# Patient Record
Sex: Male | Born: 1960 | Race: Asian | Hispanic: No
Health system: Southern US, Community
[De-identification: ages and names within clinical notes are randomized; demographics above are authoritative.]

## PROBLEM LIST (undated history)

## (undated) DIAGNOSIS — I10 Essential (primary) hypertension: Secondary | ICD-10-CM

## (undated) DIAGNOSIS — E119 Type 2 diabetes mellitus without complications: Secondary | ICD-10-CM

## (undated) DIAGNOSIS — K219 Gastro-esophageal reflux disease without esophagitis: Secondary | ICD-10-CM

## (undated) DIAGNOSIS — E785 Hyperlipidemia, unspecified: Secondary | ICD-10-CM

## (undated) HISTORY — DX: Type 2 diabetes mellitus without complications: E11.9

## (undated) HISTORY — DX: Gastro-esophageal reflux disease without esophagitis: K21.9

## (undated) HISTORY — DX: Hyperlipidemia, unspecified: E78.5

---

## 2018-07-12 ENCOUNTER — Ambulatory Visit: Payer: 59 | Attending: Family Medicine | Admitting: Physical Therapy

## 2018-07-12 ENCOUNTER — Encounter: Payer: Self-pay | Admitting: Physical Therapy

## 2018-07-12 DIAGNOSIS — M542 Cervicalgia: Secondary | ICD-10-CM

## 2018-07-12 NOTE — Therapy (Signed)
[facility] [place] [address] [facility] [address] [place], [place], [address] Phone: [phone]   Fax:  [phone]  Physical Therapy Evaluation  Patient Details  Name: Chad Mcbride MRN: [id number] Date of Birth: 1961/06/08 Referring Provider (PT): Tammy Eartha Inch, MD   Encounter Date: 07/12/2018  PT End of Session - 07/12/18 0842    Visit Number  1    Number of Visits  12    Date for PT Re-Evaluation  08/23/18    Authorization Type  UHC, VL 60    PT Start Time  0805    PT Stop Time  0850    PT Time Calculation (min)  45 min    Activity Tolerance  Patient tolerated treatment well    Behavior During Therapy  [facility] for tasks assessed/performed       History reviewed. No pertinent past medical history.  History reviewed. No pertinent surgical history.  There were no vitals filed for this visit.   Subjective Assessment - 07/12/18 0812    Subjective  Pt relays chronic neck pain for years that is off and on, localized to upper neck around occiput. He denies N/T in her arms, he has not had imaging or treatment before. He is not taking any meds other than insulin, and has not tried heat or ice. He relays aggravating factors are anything if he is hurting that day, if he is not then nothing aggravates his neck. He denies any diziness.    Pertinent History  DM    Currently in Pain?  Yes    Pain Score  5     Pain Location  Neck    Pain Orientation  Upper    Pain Descriptors / Indicators  Aching;Dull;Tightness    Pain Type  Chronic pain    Pain Onset  More than a month ago    Pain Frequency  Intermittent    Multiple Pain Sites  No         [facility] PT Assessment - 07/12/18 0001      Assessment   Medical Diagnosis  cervicalgia    Referring Provider (PT)  Tammy Eartha Inch, MD    Onset Date/Surgical Date  --   chronic   Next MD Visit  Febuary    Prior Therapy  none      Precautions   Precautions  None      Balance Screen   Has the  patient fallen in the past 6 months  No      Home Environment   Living Environment  Private residence    Additional Comments  has stairs, no trouble with this      Prior Function   Level of Independence  Independent    Vocation  Full time employment    Vocation Requirements  computer work      Cognition   Overall Cognitive Status  Within Functional Limits for tasks assessed      Observation/Other Assessments   Focus on Therapeutic Outcomes (FOTO)   37% limited      Posture/Postural Control   Posture Comments  mild fwd head, mod thoracic kyphosis      ROM / Strength   AROM / PROM / Strength  AROM;Strength      AROM   AROM Assessment Site  Cervical    Cervical Flexion  75%    Cervical Extension  50%    Cervical - Right Side Bend  50%    Cervical - Left Side  Bend  50%    Cervical - Right Rotation  65 deg    Cervical - Left Rotation  50 deg      Strength   Overall Strength Comments  elbow strength 5/5 bilat, Rt shoulder 4/5 MMT, Lt shoulder 4+ MMT      Flexibility   Soft Tissue Assessment /Muscle Length  --   tight cerv P.S, Upper traps, levator     Palpation   Spinal mobility  C-T spine hypomobility    Palpation comment  TTP C1-3      Special Tests   Other special tests  neg spulings but + distraction test                Objective measurements completed on examination: See above findings.      [facility] Adult PT Treatment/Exercise - 07/12/18 0001      Modalities   Modalities  Moist Heat;Electrical Stimulation      Moist Heat Therapy   Number Minutes Moist Heat  12 Minutes    Moist Heat Location  Cervical      Electrical Stimulation   Electrical Stimulation Location  cervical    Electrical Stimulation Action  IFC 12 min    Electrical Stimulation Parameters  supine    Electrical Stimulation Goals  Pain      Manual Therapy   Manual therapy comments  gentle C-spine central PA mobs, S.O relase, gentle manual traction             PT Education  - 07/12/18 0840    Education Details  HEP, POC,TENS    Person(s) Educated  Patient    Methods  Explanation;Demonstration;Verbal cues;Handout    Comprehension  Verbalized understanding;Need further instruction          PT Long Term Goals - 07/12/18 0854      PT LONG TERM GOAL #1   Title  Pt will be I and compliant with HEP. 6 weeks 08/23/18    Status  New      PT LONG TERM GOAL #2   Title  Pt will improve FOTO to less than 30% limited. 6 weeks 08/23/18    Status  New      PT LONG TERM GOAL #3   Title  Pt will improve neck ROM to [facility] and rotation to at least 70 deg bilat. 6 weeks 08/23/18    Status  New      PT LONG TERM GOAL #4   Title  Pt will decrease overall pain to less than 2/10 on avg with usual activity. 6 weeks 08/23/18    Status  New             Plan - 07/12/18 0842    Clinical Impression Statement  Pt presents with chronic intermittent cervicalgia likely due to C-spine hypomobility and increased muscular tension. He has decreased ROM, decreased shoulder strength, postural dysfunciton and increased pain limiting his full funciton. He will benefit from skilled PT to address his deficits.     Clinical Presentation  Evolving    Clinical Presentation due to:  worsening pain over time    Clinical Decision Making  Moderate    Rehab Potential  Good    PT Frequency  2x / week    PT Duration  6 weeks    PT Treatment/Interventions  Cryotherapy;Electrical Stimulation;Iontophoresis 4mg /ml Dexamethasone;Moist Heat;Traction;Ultrasound;Therapeutic activities;Therapeutic exercise;Neuromuscular re-education;Manual techniques;Passive range of motion;Taping;Dry needling;Joint Manipulations;Spinal Manipulations    PT Next Visit Plan  neck stretching and mobs, scapular and  shoulder strength, consider MT, modalaties, traction,DN    Consulted and Agree with Plan of Care  Patient       Patient will benefit from skilled therapeutic intervention in order to improve the following  deficits and impairments:  Decreased activity tolerance, Decreased endurance, Decreased range of motion, Decreased strength, Hypomobility, Increased fascial restricitons, Increased muscle spasms, Impaired flexibility, Improper body mechanics, Postural dysfunction, Pain  Visit Diagnosis: Cervicalgia     Problem List There are no active problems to display for this patient.   April Manson, PT, DPT 07/12/2018, 8:58 AM  [facility] [place] [facility] [address] [place], [place], [address] Phone: [phone]   Fax:  [phone]  Name: Chad Mcbride MRN: [id number] Date of Birth: 10-12-1960

## 2018-07-12 NOTE — Patient Instructions (Signed)

## 2018-07-18 ENCOUNTER — Encounter: Payer: Self-pay | Admitting: Physical Therapy

## 2018-07-18 ENCOUNTER — Ambulatory Visit: Payer: 59 | Admitting: Physical Therapy

## 2018-07-18 DIAGNOSIS — M542 Cervicalgia: Secondary | ICD-10-CM | POA: Diagnosis not present

## 2018-07-18 NOTE — Therapy (Signed)
[place], [place], [address] Phone: [phone]   Fax:  [phone]  Physical Therapy Treatment  Patient Details  Name: Chad Mcbride MRN: [id number] Date of Birth: May 21, 1961 Referring Provider (PT): Tammy Arn Medal, MD   Encounter Date: 07/18/2018  PT End of Session - 07/18/18 0842    Visit Number  2    Number of Visits  12    Date for PT Re-Evaluation  08/23/18    PT Start Time  0800    PT Stop Time  0855    PT Time Calculation (min)  55 min    Activity Tolerance  Patient tolerated treatment well    Behavior During Therapy  [facility] for tasks assessed/performed       History reviewed. No pertinent past medical history.  History reviewed. No pertinent surgical history.  There were no vitals filed for this visit.  Subjective Assessment - 07/18/18 0800    Subjective  pt reports that his neck has been feeling ok.     Currently in Pain?  Yes    Pain Score  3     Pain Location  Neck    Pain Descriptors / Indicators  Discomfort                       [facility] Adult PT Treatment/Exercise - 07/18/18 0001      Exercises   Exercises  Neck      Neck Exercises: Machines for Strengthening   UBE (Upper Arm Bike)  L2 49fd/2bak      Neck Exercises: Theraband   Shoulder Extension  20 reps;Red    Rows  20 reps;Red    Shoulder External Rotation  20 reps;Red    Horizontal ADduction  10 reps;Red      Neck Exercises: Standing   Wall Push Ups  20 reps    Other Standing Exercises  OHP yellow ball 2x10       Modalities   Modalities  Moist Heat;Electrical Stimulation      Moist Heat Therapy   Number Minutes Moist Heat  15 Minutes    Moist Heat Location  Cervical      Electrical Stimulation   Electrical Stimulation Location  cervical    Electrical Stimulation Action  IFC    Electrical Stimulation Parameters  supine    Electrical Stimulation Goals  Pain      Manual Therapy   Manual Therapy  Soft tissue mobilization;Passive ROM;Manual Traction    Soft tissue mobilization  posterior Cervical para spinales into upp er traps    Passive ROM  c spine all dirextions    Manual Traction  4x15''                  PT Long Term Goals - 07/18/18 0847      PT LONG TERM GOAL #1   Title  Pt will be I and compliant with HEP. 6 weeks 08/23/18    Status  Partially Met            Plan - 07/18/18 0844    Clinical Impression Statement  Pt tolerated an initial progression to TE well. Postural cues needed with standing rows and extensions. UE did fatigue during aerobic warm up with little resistance. Pt reports a good stretch with passive cervical rotation.     Rehab Potential  Good    PT Next Visit Plan  neck stretching and mobs, scapular and  shoulder strength, consider MT, modalities, traction,DN       Patient will benefit from skilled therapeutic intervention in order to improve the following deficits and impairments:  Decreased activity tolerance, Decreased endurance, Decreased range of motion, Decreased strength, Hypomobility, Increased fascial restricitons, Increased muscle spasms, Impaired flexibility, Improper body mechanics, Postural dysfunction, Pain  Visit Diagnosis: Cervicalgia     Problem List There are no active problems to display for this patient.   Scot Jun, PTA 07/18/2018, 8:47 AM  [address] [place], [place], [address] Phone: [phone]   Fax:  [phone]  Name: Kinston Magnan MRN: [id number] Date of Birth: October 03, 1960

## 2018-07-21 ENCOUNTER — Encounter: Payer: Self-pay | Admitting: Physical Therapy

## 2018-07-21 ENCOUNTER — Ambulatory Visit: Payer: 59 | Admitting: Physical Therapy

## 2018-07-21 DIAGNOSIS — M542 Cervicalgia: Secondary | ICD-10-CM

## 2018-07-21 NOTE — Therapy (Signed)
[place], [place], [address] Phone: [phone]   Fax:  [phone]  Physical Therapy Treatment  Patient Details  Name: Quinlan Vollmer MRN: [id number] Date of Birth: April 07, 1961 Referring Provider (PT): Tammy Arn Medal, MD   Encounter Date: 07/21/2018  PT End of Session - 07/21/18 0843    Visit Number  3    Date for PT Re-Evaluation  08/23/18    Authorization Type  UHC, VL 60    PT Start Time  0800    PT Stop Time  0855    PT Time Calculation (min)  55 min    Activity Tolerance  Patient tolerated treatment well    Behavior During Therapy  [facility] for tasks assessed/performed       History reviewed. No pertinent past medical history.  History reviewed. No pertinent surgical history.  There were no vitals filed for this visit.  Subjective Assessment - 07/21/18 0804    Subjective  PT stated that he is doing good with very little pain    Currently in Pain?  Yes    Pain Score  2     Pain Location  Neck         [facility] PT Assessment - 07/21/18 0001      AROM   Cervical Flexion  WFL    Cervical Extension  WFL    Cervical - Right Side Bend  75%    Cervical - Left Side Bend  75%    Cervical - Right Rotation  WFL    Cervical - Left Rotation  [facility]                   [facility] Adult PT Treatment/Exercise - 07/21/18 0001      Neck Exercises: Machines for Strengthening   UBE (Upper Arm Bike)  L2 36fd/3bak      Neck Exercises: Theraband   Shoulder Extension  20 reps;Green    Rows  20 reps;Green    Shoulder External Rotation  20 reps;Red    Horizontal ADduction  Red;20 reps      Neck Exercises: Standing   Wall Push Ups  20 reps    Other Standing Exercises  OHP yellow ball 2x10, Shrugs 7lb 2x10       Modalities   Modalities  Moist Heat;Electrical Stimulation      Moist Heat Therapy   Number Minutes Moist Heat  15 Minutes    Moist Heat Location  Cervical      Electrical  Stimulation   Electrical Stimulation Location  cervical    Electrical Stimulation Action  IFC    Electrical Stimulation Parameters  supine    Electrical Stimulation Goals  Pain      Manual Therapy   Manual Therapy  Soft tissue mobilization;Passive ROM;Manual Traction    Soft tissue mobilization  posterior Cervical para spinales into upp er traps    Passive ROM  c spine all dirextions    Manual Traction  3x15''                  PT Long Term Goals - 07/21/18 03094     PT LONG TERM GOAL #1   Title  Pt will be I and compliant with HEP. 6 weeks 08/23/18      PT LONG TERM GOAL #3   Title  [facility] to at least 70 deg bilat. 6 weeks 08/23/18  Status  Achieved      PT LONG TERM GOAL #4   Title  Pt will decrease overall pain to less than 2/10 on avg with usual activity. 6 weeks 08/23/18    Status  Partially Met            Plan - 07/21/18 0847    Clinical Impression Statement  pt reports very little to no pain, but some tension in the cervical spine. Good strength and ROM with good form while doing rows and extensions. Pt has progressed and completed cervical spine ROM goals. positive response to MT.    Rehab Potential  Good    PT Frequency  2x / week    PT Duration  6 weeks    PT Treatment/Interventions  Cryotherapy;Electrical Stimulation;Iontophoresis 40m/ml Dexamethasone;Moist Heat;Traction;Ultrasound;Therapeutic activities;Therapeutic exercise;Neuromuscular re-education;Manual techniques;Passive range of motion;Taping;Dry needling;Joint Manipulations;Spinal Manipulations    PT Next Visit Plan  neck stretching and mobs, scapular and shoulder strength, consider MT, modalities, traction,DN       Patient will benefit from skilled therapeutic intervention in order to improve the following deficits and impairments:  Decreased activity tolerance, Decreased endurance, Decreased range of motion, Decreased strength, Hypomobility, Increased  fascial restricitons, Increased muscle spasms, Impaired flexibility, Improper body mechanics, Postural dysfunction, Pain  Visit Diagnosis: Cervicalgia     Problem List There are no active problems to display for this patient.   RScot Jun PTA 07/21/2018, 8:54 AM  [place] [address]Phone: [phone]  Fax:  [phone] Name: TBasheer MolchanMRN: [id number] of Birth: 706/07/1961

## 2018-07-25 ENCOUNTER — Ambulatory Visit: Payer: 59 | Admitting: Physical Therapy

## 2018-07-25 ENCOUNTER — Encounter: Payer: Self-pay | Admitting: Physical Therapy

## 2018-07-25 DIAGNOSIS — M542 Cervicalgia: Secondary | ICD-10-CM

## 2018-07-25 NOTE — Therapy (Signed)
[facility] [address] [place], [place], [address] Phone: [phone]   Fax:  [phone]  Physical Therapy Treatment  Patient Details  Name: Chad Mcbride MRN: [id number] Date of Birth: 05-14-1961 Referring Provider (PT): Tammy Eartha InchLamonica Boyd, MD   Encounter Date: 07/25/2018  PT End of Session - 07/25/18 0833    Visit Number  4    Date for PT Re-Evaluation  08/23/18    Authorization Type  UHC, VL 60    PT Start Time  0800    PT Stop Time  0845    PT Time Calculation (min)  45 min       History reviewed. No pertinent past medical history.  History reviewed. No pertinent surgical history.  There were no vitals filed for this visit.  Subjective Assessment - 07/25/18 0804    Subjective  "Im at"  when asked about pain "Not really, a little bit, not really"    Currently in Pain?  Yes    Pain Score  1     Pain Location  Neck    Pain Orientation  Upper                       [facility] Adult PT Treatment/Exercise - 07/25/18 0001      Exercises   Exercises  Neck      Neck Exercises: Machines for Strengthening   UBE (Upper Arm Bike)  L2 243fwd/3bak      Neck Exercises: Theraband   Horizontal ADduction  Red;20 reps      Neck Exercises: Standing   Other Standing Exercises  OHP 5lb  2x10, Shrugs 7lb 2x10     Other Standing Exercises  Pully Ext 5lb 2x10      Modalities   Modalities  Moist Heat;Electrical Stimulation      Moist Heat Therapy   Number Minutes Moist Heat  15 Minutes    Moist Heat Location  Cervical      Electrical Stimulation   Electrical Stimulation Location  cervical    Electrical Stimulation Action  IFC    Electrical Stimulation Parameters  supine    Electrical Stimulation Goals  Pain                  PT Long Term Goals - 07/25/18 [id number]      PT LONG TERM GOAL #1   Title  Pt will be I and compliant with HEP. 6 weeks 08/23/18    Status  Achieved      PT LONG TERM  GOAL #3   Title  Pt will improve neck ROM to [facility] and rotation to at least 70 deg bilat. 6 weeks 08/23/18    Status  Achieved      PT LONG TERM GOAL #4   Title  Pt will decrease overall pain to less than 2/10 on avg with usual activity. 6 weeks 08/23/18            Plan - 07/25/18 [id number]    Clinical Impression Statement  pt continues to do well overall reporting little to no pain. No pain reported during activities and he has good strength and ROM.     Rehab Potential  Good    PT Frequency  2x / week    PT Duration  6 weeks    PT Treatment/Interventions  Cryotherapy;Electrical Stimulation;Iontophoresis 4mg /ml Dexamethasone;Moist Heat;Traction;Ultrasound;Therapeutic activities;Therapeutic exercise;Neuromuscular re-education;Manual techniques;Passive range of motion;Taping;Dry needling;Joint Manipulations;Spinal Manipulations    PT Next  Visit Plan  neck stretching and mobs, scapular and shoulder strength, consider MT, modalaties, traction,DN       Patient will benefit from skilled therapeutic intervention in order to improve the following deficits and impairments:  Decreased activity tolerance, Decreased endurance, Decreased range of motion, Decreased strength, Hypomobility, Increased fascial restricitons, Increased muscle spasms, Impaired flexibility, Improper body mechanics, Postural dysfunction, Pain  Visit Diagnosis: Cervicalgia     Problem List There are no active problems to display for this patient.   Grayce Sessions, PTA 07/25/2018, 8:34 AM  [facility] [place] [facility] [address] [place], [place], [address] Phone: [phone]   Fax:  [phone]  Name: Chad Mcbride MRN: [id number] Date of Birth: [birth year]

## 2018-07-28 ENCOUNTER — Ambulatory Visit: Payer: 59 | Admitting: Physical Therapy

## 2018-07-28 DIAGNOSIS — M542 Cervicalgia: Secondary | ICD-10-CM | POA: Diagnosis not present

## 2018-07-28 NOTE — Therapy (Signed)
[facility] [place] [facility] [address] [place], [place], [address] Phone: [phone]   Fax:  [phone]  Physical Therapy Treatment  Patient Details  Name: Chad Mcbride MRN: [id number] Date of Birth: [birth year] Referring Provider (PT): Tammy Eartha Inch, MD   Encounter Date: 07/28/2018  PT End of Session - 07/28/18 0844    Visit Number  5    Number of Visits  12    Date for PT Re-Evaluation  08/23/18    PT Start Time  0811    PT Stop Time  0855    PT Time Calculation (min)  44 min       No past medical history on file.  No past surgical history on file.  There were no vitals filed for this visit.  Subjective Assessment - 07/28/18 0811    Subjective  "Im ok, in a little bit of pain"    Currently in Pain?  Yes    Pain Score  2     Pain Location  Neck                       [facility] Adult PT Treatment/Exercise - 07/28/18 0001      Exercises   Exercises  Neck      Neck Exercises: Machines for Strengthening   UBE (Upper Arm Bike)  L2 64fwd/3bak    Cybex Row  10# 3x10    Lat Pull  15# 3x10      Neck Exercises: Theraband   Shoulder Extension  20 reps;Green    Horizontal ADduction  20 reps;Green    Other Theraband Exercises  wall walks, yellow x5      Neck Exercises: Standing   Wall Push Ups  20 reps    Other Standing Exercises  OHP 5lb  2x10, Shrugs 8lb 2x10       Modalities   Modalities  Moist Heat;Electrical Stimulation      Moist Heat Therapy   Number Minutes Moist Heat  15 Minutes    Moist Heat Location  Cervical      Electrical Stimulation   Electrical Stimulation Location  cervical    Electrical Stimulation Action  IFC    Electrical Stimulation Parameters  supine    Electrical Stimulation Goals  Pain                  PT Long Term Goals - 07/25/18 1324      PT LONG TERM GOAL #1   Title  Pt will be I and compliant with HEP. 6 weeks 08/23/18    Status  Achieved      PT  LONG TERM GOAL #3   Title  Pt will improve neck ROM to [facility] and rotation to at least 70 deg bilat. 6 weeks 08/23/18    Status  Achieved      PT LONG TERM GOAL #4   Title  Pt will decrease overall pain to less than 2/10 on avg with usual activity. 6 weeks 08/23/18            Plan - 07/28/18 0845    Clinical Impression Statement  Pt tolerated progression of exercises well in todays session without adverse affects. Pt demonstrated good body machanics with all activities. Pt continues to progress towards all therapy goals at this time. Pt required min VC to bring arms down to chest level during banded wall walks.     Rehab Potential  Good  PT Frequency  2x / week    PT Duration  6 weeks    PT Treatment/Interventions  Cryotherapy;Electrical Stimulation;Iontophoresis 4mg /ml Dexamethasone;Moist Heat;Traction;Ultrasound;Therapeutic activities;Therapeutic exercise;Neuromuscular re-education;Manual techniques;Passive range of motion;Taping;Dry needling;Joint Manipulations;Spinal Manipulations    PT Next Visit Plan  Continue adding exercises and stretching as needed    Consulted and Agree with Plan of Care  Patient       Patient will benefit from skilled therapeutic intervention in order to improve the following deficits and impairments:  Decreased activity tolerance, Decreased endurance, Decreased range of motion, Decreased strength, Hypomobility, Increased fascial restricitons, Increased muscle spasms, Impaired flexibility, Improper body mechanics, Postural dysfunction, Pain  Visit Diagnosis: Cervicalgia     Problem List There are no active problems to display for this patient.   Duanne MoronJacob Markia Kyer, SPTA 07/28/2018, 8:48 AM  [facility] [address] [place], [place], [address] Phone: [phone]   Fax:  [phone]  Name: Chad Mcbride MRN: [id number] Date of Birth: July 24, 1961

## 2018-07-31 ENCOUNTER — Ambulatory Visit: Payer: 59 | Admitting: Physical Therapy

## 2018-07-31 DIAGNOSIS — M542 Cervicalgia: Secondary | ICD-10-CM

## 2018-07-31 NOTE — Therapy (Addendum)
[facility] [place], [place], [address] Phone: [phone]   Fax:  [phone]  Physical Therapy Treatment  Patient Details  Name: Chad Mcbride MRN: [id number] Date of Birth: 03-Jan-1961 Referring Provider (PT): Tammy Arn Medal, MD   Encounter Date: 07/31/2018  PT End of Session - 07/31/18 0839    Visit Number  6    Number of Visits  12    Date for PT Re-Evaluation  08/23/18    Authorization Type  [employer], VL 60    PT Start Time  0800    PT Stop Time  0855    PT Time Calculation (min)  55 min    Activity Tolerance  Patient tolerated treatment well    Behavior During Therapy  [facility] for tasks assessed/performed       No past medical history on file.  No past surgical history on file.  There were no vitals filed for this visit.  Subjective Assessment - 07/31/18 0800    Subjective  "Im ok, I think I am almost back to normal"    Currently in Pain?  Yes    Pain Score  1     Pain Location  Neck                       [facility] Adult PT Treatment/Exercise - 07/31/18 0001      Neck Exercises: Machines for Strengthening   UBE (Upper Arm Bike)  L3 49fd/3bak    Cybex Row  15# 3x10    Lat Pull  20# 3x10      Neck Exercises: Theraband   Shoulder Extension  15 reps;Green   2 sets   Horizontal ADduction  15 reps;Green   2 sets   Other Theraband Exercises  wall walks, yellow x5      Neck Exercises: Standing   Wall Push Ups  20 reps    Other Standing Exercises  OHP 6lb  2x10, Shrugs 10lb 2x10       Modalities   Modalities  Moist Heat;Electrical Stimulation      Moist Heat Therapy   Number Minutes Moist Heat  15 Minutes    Moist Heat Location  Cervical      Electrical Stimulation   Electrical Stimulation Location  cervical    Electrical Stimulation Action  IFC    Electrical Stimulation Parameters  supine    Electrical Stimulation Goals  Pain      Manual Therapy   Manual Therapy   Soft tissue mobilization;Passive ROM;Manual Traction                  PT Long Term Goals - 07/31/18 0842      PT LONG TERM GOAL #4   Title  Pt will decrease overall pain to less than 2/10 on avg with usual activity. 6 weeks 08/23/18    Status  Achieved            Plan - 07/31/18 0840    Clinical Impression Statement  Pt tolerated treatment well today without adverse affects. Pt demonstrated good body machanics with exercises today. Pt required min VC to bring arms closer to side with wall push ups. Pt has tendency to speed through reps and required min VC to slow down. Pt verbally reports that pain is 1/10 and that he is close to normal again.     Rehab Potential  Good    PT Frequency  2x /  week    PT Duration  6 weeks    PT Treatment/Interventions  Cryotherapy;Electrical Stimulation;Iontophoresis 74m/ml Dexamethasone;Moist Heat;Traction;Ultrasound;Therapeutic activities;Therapeutic exercise;Neuromuscular re-education;Manual techniques;Passive range of motion;Taping;Dry needling;Joint Manipulations;Spinal Manipulations    PT Next Visit Plan  Continue adding exercises and stretching as needed    Consulted and Agree with Plan of Care  Patient       Patient will benefit from skilled therapeutic intervention in order to improve the following deficits and impairments:  Decreased activity tolerance, Decreased endurance, Decreased range of motion, Decreased strength, Hypomobility, Increased fascial restricitons, Increased muscle spasms, Impaired flexibility, Improper body mechanics, Postural dysfunction, Pain  Visit Diagnosis: Cervicalgia     Problem List There are no active problems to display for this patient. PHYSICAL THERAPY DISCHARGE SUMMARY   Plan: Patient agrees to discharge.  Patient goals were partially met. Patient is being discharged due to not returning since the last visit.  ?????       JEtta Quill11/25/2019, 8:43 AM  [place] [address]Phone: [phone]  Fax:  [phone] Name: Chad AlbusMRN: [id number] of Birth: 713-Feb-1962

## 2018-08-02 ENCOUNTER — Ambulatory Visit: Payer: 59 | Admitting: Physical Therapy

## 2018-08-08 ENCOUNTER — Ambulatory Visit: Payer: 59 | Admitting: Physical Therapy

## 2018-08-11 ENCOUNTER — Ambulatory Visit: Payer: 59 | Admitting: Physical Therapy

## 2019-08-06 ENCOUNTER — Encounter: Payer: Self-pay | Admitting: Cardiology

## 2019-12-12 ENCOUNTER — Encounter: Payer: Self-pay | Admitting: Cardiology

## 2019-12-13 NOTE — Progress Notes (Signed)
Cardiology Office Note   Date:  12/14/2019   ID:  Chad Mcbride, DOB Aug 02, 1961, MRN [id number]  PCP:  [name], MD  Cardiologist:   Joette Schmoker [place], MD   Chief Complaint  Patient presents with  . Chest Pain      History of Present Illness: Chad Mcbride is a 59 y.o. male who is seen at the request of Dr Leavy Cella for evaluation of chest pain, abnormal Ecg. He has a history of HLD and DM on insulin. He is a smoker. He reports symptoms of epigastric and lower chest pain. Usually occurs at night and feels like heartburn. Takes Tums but not much relief. Just started omeprazole last night. Pain may radiate to his shoulders. Feels occasional skipped beat. Reports not taking statin regularly. No prior cardiac history. Reports ETT in remote past.    Past Medical History:  Diagnosis Date  . Diabetes mellitus without complication (HCC)   . GERD (gastroesophageal reflux disease)   . Hyperlipidemia     History reviewed. No pertinent surgical history.   Current Outpatient Medications  Medication Sig Dispense Refill  . Insulin Glargine (BASAGLAR KWIKPEN) 100 UNIT/ML     . sitaGLIPtin-metformin (JANUMET) 50-1000 MG tablet Take by mouth.    . tamsulosin (FLOMAX) 0.4 MG CAPS capsule Take 0.4 mg by mouth daily.    . empagliflozin (JARDIANCE) 25 MG TABS tablet Take 25 mg by mouth daily before breakfast. 30 tablet   . omeprazole (PRILOSEC) 20 MG capsule Take 1 capsule (20 mg total) by mouth daily. 30 capsule 11  . pravastatin (PRAVACHOL) 20 MG tablet Take 1 tablet (20 mg total) by mouth every evening. 90 tablet 3   No current facility-administered medications for this visit.    Allergies:   Patient has no allergy information on record.    Social History:  The patient  reports that he has been smoking. He has been smoking about 0.25 packs per day. He has never used smokeless tobacco. He reports previous alcohol use.   Family History:  The patient's family history includes Alcohol  abuse in his father; Diabetes in his mother.    ROS:  Please see the history of present illness.   Otherwise, review of systems are positive for none.   All other systems are reviewed and negative.    PHYSICAL EXAM: VS:  BP (!) 140/102   Pulse 87   Ht 5' 3.5" (1.613 m)   Wt 137 lb 3.2 oz (62.2 kg)   SpO2 97%   BMI 23.92 kg/m  , BMI Body mass index is 23.92 kg/m. GEN: Well nourished, well developed, in no acute distress  HEENT: normal  Neck: no JVD, carotid bruits, or masses Cardiac: RRR; no murmurs, rubs, or gallops,no edema  Respiratory:  clear to auscultation bilaterally, normal work of breathing GI: soft, nontender, nondistended, + BS MS: no deformity or atrophy  Skin: warm and dry, no rash Neuro:  Strength and sensation are intact Psych: euthymic mood, full affect   EKG:  EKG is not ordered today. The ekg ordered 12/12/19 demonstrates NSR with decreased R wave V3, otherwise normal. I have personally reviewed and interpreted this study.    Recent Labs: No results found for requested labs within last 8760 hours.    Lipid Panel No results found for: CHOL, TRIG, HDL, CHOLHDL, VLDL, LDLCALC, LDLDIRECT    Wt Readings from Last 3 Encounters:  12/14/19 137 lb 3.2 oz (62.2 kg)      Other studies Reviewed: Additional  studies/ records that were reviewed today include:   Labs dated 08/06/19: cholesterol 202, triglycerides 94, HDL 68, LDL 115. TSH normal. CBC normal.  Dated 12/12/19: A1c 7.5%. glucose 111. Otherwise CMET normal.  Ecg " NSR, cannot rule out anterior infarct- age undetermined"    ASSESSMENT AND PLAN:  1.  Chest pain. Atypical. Symptoms most likely GERD but he does have multiple cardiac risk factors including DM on insulin, tobacco use, and HLD. Recommend ETT to further stratify cardiac risk 2. DM on insulin. Per primary care 3. HLD -- with DM goal LDL < 70. Recommend he take statin daily.  4. Tobacco abuse. Recommend complete cessation.    Current  medicines are reviewed at length with the patient today.  The patient does not have concerns regarding medicines.  The following changes have been made:  no change  Labs/ tests ordered today include:   Orders Placed This Encounter  Procedures  . Exercise Tolerance Test     Disposition:   FU TBD based on ETT results.   Signed, Jahnae Mcadoo [place], MD  12/14/2019 9:27 AM    [facility] [address], [place], [place], [address] Phone [phone], Fax [phone]

## 2019-12-14 ENCOUNTER — Encounter: Payer: Self-pay | Admitting: Cardiology

## 2019-12-14 ENCOUNTER — Other Ambulatory Visit: Payer: Self-pay

## 2019-12-14 ENCOUNTER — Ambulatory Visit: Payer: 59 | Admitting: Cardiology

## 2019-12-14 VITALS — BP 140/102 | HR 87 | Ht 63.5 in | Wt 137.2 lb

## 2019-12-14 DIAGNOSIS — Z720 Tobacco use: Secondary | ICD-10-CM

## 2019-12-14 DIAGNOSIS — E7800 Pure hypercholesterolemia, unspecified: Secondary | ICD-10-CM | POA: Diagnosis not present

## 2019-12-14 DIAGNOSIS — R079 Chest pain, unspecified: Secondary | ICD-10-CM

## 2019-12-14 DIAGNOSIS — E119 Type 2 diabetes mellitus without complications: Secondary | ICD-10-CM

## 2019-12-14 MED ORDER — PRAVASTATIN SODIUM 20 MG PO TABS: 20.0000 mg | ORAL_TABLET | Freq: Every evening | ORAL | 3 refills | Status: DC

## 2019-12-14 MED ORDER — OMEPRAZOLE 20 MG PO CPDR: 20.0000 mg | DELAYED_RELEASE_CAPSULE | Freq: Every day | ORAL | 11 refills | Status: DC

## 2019-12-14 MED ORDER — JARDIANCE 25 MG PO TABS: 25.0000 mg | ORAL_TABLET | Freq: Every day | ORAL | Status: AC

## 2019-12-14 NOTE — Patient Instructions (Signed)
Take pravastatin daily for cholesterol   Stop smoking  Get regular exercise  We will schedule you for a stress test

## 2019-12-18 ENCOUNTER — Telehealth (HOSPITAL_COMMUNITY): Payer: Self-pay | Admitting: Cardiology

## 2019-12-18 NOTE — Telephone Encounter (Signed)
Called patient and left message to call office for new appointment time for EET.  We had to reschedule his appt on 01/04/2020 due to Nuclear Schedule at [facility]. Please inform patient of appt date and for COVID test and ETT.

## 2019-12-21 ENCOUNTER — Encounter (HOSPITAL_COMMUNITY): Payer: 59

## 2019-12-28 ENCOUNTER — Encounter (HOSPITAL_COMMUNITY): Payer: 59

## 2020-01-01 ENCOUNTER — Other Ambulatory Visit (HOSPITAL_COMMUNITY): Payer: 59

## 2020-01-03 ENCOUNTER — Telehealth (HOSPITAL_COMMUNITY): Payer: Self-pay

## 2020-01-03 NOTE — Telephone Encounter (Signed)
Encounter complete. 

## 2020-01-04 ENCOUNTER — Other Ambulatory Visit (HOSPITAL_COMMUNITY)
Admission: RE | Admit: 2020-01-04 | Discharge: 2020-01-04 | Disposition: A | Discharge status: Home / Self Care | Payer: 59 | Source: Ambulatory Visit | Attending: Cardiology | Admitting: Cardiology

## 2020-01-04 ENCOUNTER — Inpatient Hospital Stay (HOSPITAL_COMMUNITY): Admission: RE | Admit: 2020-01-04 | Payer: 59 | Source: Ambulatory Visit

## 2020-01-04 DIAGNOSIS — Z20822 Contact with and (suspected) exposure to covid-19: Secondary | ICD-10-CM | POA: Diagnosis not present

## 2020-01-04 DIAGNOSIS — Z01812 Encounter for preprocedural laboratory examination: Secondary | ICD-10-CM | POA: Diagnosis not present

## 2020-01-04 LAB — SARS CORONAVIRUS 2 (TAT 6-24 HRS): SARS Coronavirus 2: NEGATIVE

## 2020-01-08 ENCOUNTER — Other Ambulatory Visit: Payer: Self-pay

## 2020-01-08 ENCOUNTER — Ambulatory Visit (HOSPITAL_COMMUNITY)
Admission: RE | Admit: 2020-01-08 | Discharge: 2020-01-08 | Disposition: A | Discharge status: Home / Self Care | Payer: 59 | Source: Ambulatory Visit | Attending: Cardiovascular Disease | Admitting: Cardiovascular Disease

## 2020-01-08 DIAGNOSIS — E119 Type 2 diabetes mellitus without complications: Secondary | ICD-10-CM | POA: Diagnosis not present

## 2020-01-08 DIAGNOSIS — Z720 Tobacco use: Secondary | ICD-10-CM | POA: Insufficient documentation

## 2020-01-08 DIAGNOSIS — R079 Chest pain, unspecified: Secondary | ICD-10-CM | POA: Diagnosis not present

## 2020-01-08 DIAGNOSIS — E7800 Pure hypercholesterolemia, unspecified: Secondary | ICD-10-CM | POA: Insufficient documentation

## 2020-01-08 LAB — EXERCISE TOLERANCE TEST
Estimated workload: 11.2 METS
Exercise duration (min): 9 min
Exercise duration (sec): 40 s
MPHR: 163 {beats}/min
Peak HR: 164 {beats}/min
Percent HR: 100 %
RPE: 17
Rest HR: 85 {beats}/min

## 2020-01-09 ENCOUNTER — Other Ambulatory Visit: Payer: Self-pay

## 2020-01-09 DIAGNOSIS — E119 Type 2 diabetes mellitus without complications: Secondary | ICD-10-CM

## 2020-01-09 DIAGNOSIS — R079 Chest pain, unspecified: Secondary | ICD-10-CM

## 2020-01-09 MED ORDER — METOPROLOL TARTRATE 100 MG PO TABS
ORAL_TABLET | ORAL | 0 refills | Status: DC
Start: 2020-01-09 — End: 2020-03-24

## 2020-01-09 NOTE — Progress Notes (Signed)
Your cardiac CT will be scheduled at one of the below locations:   [facility] [address] [place], [place] [address] [phone]  OR  [facility] [address] [place], [place] [address] [phone]  If scheduled at [facility], please arrive at the [facility] main entrance of [facility] 30 minutes prior to test start time. Proceed to the [facility] Radiology Department (first floor) to check-in and test prep.  If scheduled at [facility], please arrive 15 mins early for check-in and test prep.  Please follow these instructions carefully (unless otherwise directed):  Hold all erectile dysfunction medications at least 3 days (72 hrs) prior to test.  On the Night Before the Test: . Be sure to Drink plenty of water. . Do not consume any caffeinated/decaffeinated beverages or chocolate 12 hours prior to your test. . Do not take any antihistamines 12 hours prior to your test. . If you take Janumet do not take 24 hours prior to test.   On the Day of the Test: . Drink plenty of water. Do not drink any water within one hour of the test. . Do not eat any food 4 hours prior to the test. . You may take your regular medications prior to the test.  . Take metoprolol 100 mg two hours prior to test. . HOLD Insulin, Janumet and Jardiance morning of test.          After the Test: . Drink plenty of water. . After receiving IV contrast, you may experience a mild flushed feeling. This is normal. . On occasion, you may experience a mild rash up to 24 hours after the test. This is not dangerous. If this occurs, you can take Benadryl 25 mg and increase your fluid intake. . If you experience trouble breathing, this can be serious. If it is severe call 911 IMMEDIATELY. If it is mild, please call our office. . If you take any of these medications: Janumet please do not take 48 hours after  completing test unless otherwise instructed.   Once we have confirmed authorization from your insurance company, we will call you to set up a date and time for your test.   For non-scheduling related questions, please contact the cardiac imaging nurse navigator should you have any questions/concerns: Rockwell Alexandria, RN Navigator Cardiac Imaging [facility] [phone] office  For scheduling needs, including cancellations and rescheduling, please call [phone].

## 2020-01-30 ENCOUNTER — Telehealth (HOSPITAL_COMMUNITY): Payer: Self-pay | Admitting: *Deleted

## 2020-01-30 NOTE — Telephone Encounter (Signed)
Reaching out to patient to offer assistance regarding upcoming cardiac imaging study; pt verbalizes understanding of appt date/time, parking situation and where to check in, pre-test NPO status and medications ordered, and verified current allergies; name and call back number provided for further questions should they arise  Penney Domanski Tai RN Navigator Cardiac Imaging [facility] [phone] office [phone] cell 

## 2020-01-31 ENCOUNTER — Ambulatory Visit (HOSPITAL_COMMUNITY)
Admission: RE | Admit: 2020-01-31 | Discharge: 2020-01-31 | Disposition: A | Discharge status: Home / Self Care | Payer: 59 | Source: Ambulatory Visit | Attending: Cardiology | Admitting: Cardiology

## 2020-01-31 DIAGNOSIS — R079 Chest pain, unspecified: Secondary | ICD-10-CM | POA: Diagnosis not present

## 2020-01-31 DIAGNOSIS — I2510 Atherosclerotic heart disease of native coronary artery without angina pectoris: Secondary | ICD-10-CM

## 2020-01-31 LAB — BASIC METABOLIC PANEL
BUN/Creatinine Ratio: 12 (ref 9–20)
BUN: 13 mg/dL (ref 6–24)
CO2: 23 mmol/L (ref 20–29)
Calcium: 9.3 mg/dL (ref 8.7–10.2)
Chloride: 105 mmol/L (ref 96–106)
Creatinine, Ser: 1.1 mg/dL (ref 0.76–1.27)
GFR calc Af Amer: 86 mL/min/{1.73_m2} (ref >59)
GFR calc non Af Amer: 74 mL/min/{1.73_m2} (ref >59)
Glucose: 222 mg/dL — ABNORMAL HIGH (ref 65–99)
Potassium: 4.7 mmol/L (ref 3.5–5.2)
Sodium: 140 mmol/L (ref 134–144)

## 2020-01-31 MED ORDER — IOHEXOL 350 MG/ML SOLN
80.0000 mL | Freq: Once | INTRAVENOUS | Status: AC | PRN
  Administered 2020-01-31: 80 mL via INTRAVENOUS

## 2020-01-31 MED ORDER — NITROGLYCERIN 0.4 MG SL SUBL
0.8000 mg | SUBLINGUAL_TABLET | Freq: Once | SUBLINGUAL | Status: AC
  Administered 2020-01-31: 0.8 mg via SUBLINGUAL

## 2020-01-31 NOTE — Progress Notes (Signed)
Pt tolerated procedure very well. Discharged home via ambulation to main entrance.

## 2020-02-01 ENCOUNTER — Ambulatory Visit (HOSPITAL_COMMUNITY)
Admission: RE | Admit: 2020-02-01 | Discharge: 2020-02-01 | Disposition: A | Discharge status: Home / Self Care | Payer: 59 | Source: Ambulatory Visit | Attending: Cardiology | Admitting: Cardiology

## 2020-02-01 DIAGNOSIS — R079 Chest pain, unspecified: Secondary | ICD-10-CM | POA: Diagnosis not present

## 2020-02-03 DIAGNOSIS — I2510 Atherosclerotic heart disease of native coronary artery without angina pectoris: Secondary | ICD-10-CM | POA: Diagnosis not present

## 2020-02-06 ENCOUNTER — Telehealth: Payer: Self-pay

## 2020-02-06 MED ORDER — ROSUVASTATIN CALCIUM 40 MG PO TABS: 40.0000 mg | ORAL_TABLET | Freq: Every day | ORAL | 3 refills | Status: DC

## 2020-02-06 MED ORDER — METOPROLOL SUCCINATE ER 50 MG PO TB24
50.0000 mg | ORAL_TABLET | Freq: Every day | ORAL | 3 refills | Status: DC
Start: 2020-02-06 — End: 2020-03-24

## 2020-02-06 NOTE — Telephone Encounter (Signed)
Spoke to patient coronary ct results given.Dr.Jordan advised to stop Pravastatin and start Crestor 40 mg daily.Advised to start Toprol XL 50 mg daily.

## 2020-02-19 ENCOUNTER — Telehealth: Payer: Self-pay | Admitting: Cardiology

## 2020-02-19 NOTE — Telephone Encounter (Signed)
Returned call to patient he stated he will keep appointment scheduled with Dr.Jordan 7/19 at 2:00 pm to discuss coronary ct results.Stated he could not take Toprol 50 mg daily made him feel too tired.He will try taking 1/2 tablet 25 mg daily.

## 2020-02-19 NOTE — Telephone Encounter (Signed)
Patient states he is returning Cheryl's call. Hung up while on hold.

## 2020-03-20 NOTE — Progress Notes (Signed)
Cardiology Office Note   Date:  03/24/2020   ID:  Chad Mcbride, DOB May 22, 1961, MRN [id number]  PCP:  Chad Au, MD  Cardiologist:   Chad Sw[place], MD   Chief Complaint  Patient presents with  . Coronary Artery Disease      History of Present Illness: Chad Mcbride is a 59 y.o. male who is seen at the request of Dr Chad Mcbride for evaluation of chest pain, abnormal Ecg. He has a history of HLD and DM on insulin. He is a smoker. He reports symptoms of epigastric and lower chest pain. Usually occurs at night and feels like heartburn. Takes Tums but not much relief. He was started on omeprazole.   He underwent ETT for evaluation which demonstrated evidence of ischemia by Ecg criteria at good exercise level. This led to coronary CTA showing evidence of CAD in mid to distal LAD, diagonal, and mid LCx.  He was placed on high dose Crestor instead of pravastatin. Also on Toprol XL. He noted the Toprol made him very fatigued so he stopped it.   Since then he has had no chest pain. Is no longer on prilosec. Still smokes some. Is tolerating Crestor well.   Past Medical History:  Diagnosis Date  . Diabetes mellitus without complication (HCC)   . GERD (gastroesophageal reflux disease)   . Hyperlipidemia     No past surgical history on file.   Current Outpatient Medications  Medication Sig Dispense Refill  . empagliflozin (JARDIANCE) 25 MG TABS tablet Take 25 mg by mouth daily before breakfast. 30 tablet   . Insulin Glargine (BASAGLAR KWIKPEN) 100 UNIT/ML Inject 21 Units into the skin daily.     . rosuvastatin (CRESTOR) 40 MG tablet Take 1 tablet (40 mg total) by mouth daily. 90 tablet 3  . sitaGLIPtin-metformin (JANUMET) 50-1000 MG tablet Take by mouth.    . tamsulosin (FLOMAX) 0.4 MG CAPS capsule Take 0.4 mg by mouth daily.     No current facility-administered medications for this visit.    Allergies:   Patient has no known allergies.    Social History:  The patient  reports  that he has been smoking. He has been smoking about 0.25 packs per day. He has never used smokeless tobacco. He reports previous alcohol use.   Family History:  The patient's family history includes Alcohol abuse in his father; Diabetes in his mother.    ROS:  Please see the history of present illness.   Otherwise, review of systems are positive for none.   All other systems are reviewed and negative.    PHYSICAL EXAM: VS:  BP 134/72   Pulse 82   Ht 5\' 4"  (1.626 m)   Wt 134 lb (60.8 kg)   BMI 23.00 kg/m  , BMI Body mass index is 23 kg/m. GEN: Well nourished, well developed, in no acute distress  HEENT: normal  Neck: no JVD, carotid bruits, or masses Cardiac: RRR; no murmurs, rubs, or gallops,no edema  Respiratory:  clear to auscultation bilaterally, normal work of breathing GI: soft, nontender, nondistended, + BS MS: no deformity or atrophy  Skin: warm and dry, no rash Neuro:  Strength and sensation are intact Psych: euthymic mood, full affect   EKG:  EKG is not ordered today. The ekg ordered 12/12/19 demonstrates NSR with decreased R wave V3, otherwise normal. I have personally reviewed and interpreted this study.    Recent Labs: 01/30/2020: BUN 13; Creatinine, Ser 1.10; Potassium 4.7; Sodium 140  Lipid Panel No results found for: CHOL, TRIG, HDL, CHOLHDL, VLDL, LDLCALC, LDLDIRECT    Wt Readings from Last 3 Encounters:  03/24/20 134 lb (60.8 kg)  12/14/19 137 lb 3.2 oz (62.2 kg)      Other studies Reviewed: Additional studies/ records that were reviewed today include:   Labs dated 08/06/19: cholesterol 202, triglycerides 94, HDL 68, LDL 115. TSH normal. CBC normal.  Dated 12/12/19: A1c 7.5%. glucose 111. Otherwise CMET normal.    Ecg " NSR, cannot rule out anterior infarct- age undetermined"   ETT 01/08/20: Study Highlights    Blood pressure demonstrated a normal response to exercise.  Horizontal ST segment depression ST segment depression was noted during  stress in the V4 and V5 leads, and returning to baseline after less than 1 minute of recovery.   ETT with good exercise tolerance (9:40); no chest pain; normal blood pressure response; 1 to 2 mm of ST depression in leads V4/V5 with peak activity felt to be consistent with ischemia; abnormal exercise treadmill; consider Lexiscan nuclear study or cardiac CTA if clinically indicated.  Coronary CTA 01/31/20: ADDENDUM REPORT: 02/01/2020 18:25  CLINICAL DATA:  62M with chest pain, hyperlipidemia, diabetes and chest pain.  EXAM: Cardiac/Coronary  CT  TECHNIQUE: The patient was scanned on a [employer].  FINDINGS: A 120 kV prospective scan was triggered in the descending thoracic aorta at 111 HU's. Axial non-contrast 3 mm slices were carried out through the heart. The data set was analyzed on a dedicated work station and scored using the Agatson method. Gantry rotation speed was 250 msecs and collimation was .6 mm. No beta blockade and 0.8 mg of sl NTG was given. The 3D data set was reconstructed in 5% intervals of the 67-82 % of the R-R cycle. Diastolic phases were analyzed on a dedicated work station using MPR, MIP and VRT modes. The patient received 80 cc of contrast.  Aorta: Ascending aorta mildly dilated. 3.7 cm. No calcifications. No dissection.  Aortic Valve:  Trileaflet.  No calcifications.  Coronary Arteries:  Normal coronary origin.  Right dominance.  RCA is a large dominant artery that gives rise to PDA and PLVB. There is minimal (<25%) calcified plaque proximally and diffuse, minimal low attenuation plaque in mid RCA.  Left main is a large artery that gives rise to LAD, RI, and LCX arteries. There is no plaque.  LAD is a large vessel that has mild (25-49%) calcified plaque proximally with moderate (50-69%) mixed plaque at the level of D1. There is mild mixed plaque in the mid and distal LAD. D1 is heavily calcified at the ostium making degree of  stenosis difficult to assess. There is moderate mixed plaque in mid D1.  RI is a smalll vessel with moderate ostial calcification.  LCX is a non-dominant artery that gives rise to one large OM1 branch. There is mild, diffuse plaque proximally. The LCX is small (<2 mm) distal to OM1. There is severe (>70%) obstruction in the mid LCX. OM1 has moderate calcified plaque. OM2 is small.  Other findings:  Normal pulmonary vein drainage into the left atrium.  Normal let atrial appendage without a thrombus.  Normal size of the pulmonary artery.  IMPRESSION: 1. Coronary calcium score of 375. This was 96th percentile for age and sex matched control.  2. Normal coronary origin with right dominance.  3. 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 The vessel is small in this region. (CAD-RADS 4)  4. There is moderate (50-69%) stenosis in the  LAD at the level of D1.  5.  Will send study for FFRct.  Chilton Si, MD   Electronically Signed   By: Chilton Si   On: 02/01/2020 18:25  FFRCT analysis for 17M with abnormal coronary CT-A  FINDINGS: FFRct analysis was performed on the original cardiac CT angiogram dataset. Diagrammatic representation of the FFRct analysis is provided in a separate PDF document in PACS. This dictation was created using the PDF document and an interactive 3D model of the results. 3D model is not available in the EMR/PACS. Normal FFR range is >0.80.  1. Left Main: FFRct 0.97  2. LAD: FFRct 0.94 proximal, 0.84 mid, 0.61 distal. D1 FFRct 0.84 proximal, 0.69 distal.  3. LCX: FFRct 0.97 proximal.  LCX is occluded in the mid vessel.  4. RCA: FFRct 0.96 proximal, 0.91 mid, 0.86 distal.  IMPRESSION: 1.  FFRct demonstrates occlusion in the mid LCX.  2. FFRct is consistent with obstructive disease in D1 and in the distal LAD.   Electronically Signed   By: Chilton Si   On: 02/03/2020 08:43   ASSESSMENT AND PLAN:   1. CAD. Atypical symptoms. Now resolved. He does have multiple cardiac risk factors including DM on insulin, tobacco use, and HLD. Abnormal ETT. Coronary CTA results as noted. He has small vessel obstructive disease by CT. No major vessel stenosis. He is currently asymptomatic. No indication for invasive evaluation at present. Exercise tolerance is very good. Intolerant of Toprol due to fatigue. For now will treat with ASA 81 mg only. If he were to develop angina could add nitrates. Focus on risk factor modification. 2. DM on insulin. Per primary care 3. HLD -- with DM goal LDL < 70. Now on high dose Crestor. Will repeat fasting labs in 6 weeks.  4. Tobacco abuse. Recommend complete cessation.    Current medicines are reviewed at length with the patient today.  The patient does not have concerns regarding medicines.  The following changes have been made:  See above.   Labs/ tests ordered today include:   No orders of the defined types were placed in this encounter.    Disposition:   FU 6 months  Signed, Chad Swaz[place], MD  03/24/2020 2:16 PM    [facility] [address], [place], [place], [address] Phone [phone], Fax [phone]

## 2020-03-24 ENCOUNTER — Encounter: Payer: Self-pay | Admitting: Cardiology

## 2020-03-24 ENCOUNTER — Ambulatory Visit: Payer: 59 | Admitting: Cardiology

## 2020-03-24 ENCOUNTER — Other Ambulatory Visit: Payer: Self-pay

## 2020-03-24 VITALS — BP 134/72 | HR 82 | Ht 64.0 in | Wt 134.0 lb

## 2020-03-24 DIAGNOSIS — E7800 Pure hypercholesterolemia, unspecified: Secondary | ICD-10-CM

## 2020-03-24 DIAGNOSIS — I25118 Atherosclerotic heart disease of native coronary artery with other forms of angina pectoris: Secondary | ICD-10-CM | POA: Diagnosis not present

## 2020-03-24 DIAGNOSIS — E119 Type 2 diabetes mellitus without complications: Secondary | ICD-10-CM

## 2020-03-24 DIAGNOSIS — Z720 Tobacco use: Secondary | ICD-10-CM | POA: Diagnosis not present

## 2020-05-03 LAB — LIPID PANEL
Chol/HDL Ratio: 1.9 ratio (ref 0.0–5.0)
Cholesterol, Total: 112 mg/dL (ref 100–199)
HDL: 58 mg/dL (ref >39)
LDL Chol Calc (NIH): 43 mg/dL (ref 0–99)
Triglycerides: 44 mg/dL (ref 0–149)
VLDL Cholesterol Cal: 11 mg/dL (ref 5–40)

## 2020-05-03 LAB — HEPATIC FUNCTION PANEL
ALT: 28 IU/L (ref 0–44)
AST: 23 IU/L (ref 0–40)
Albumin: 4.3 g/dL (ref 3.8–4.9)
Alkaline Phosphatase: 48 IU/L (ref 48–121)
Bilirubin Total: 0.4 mg/dL (ref 0.0–1.2)
Bilirubin, Direct: 0.16 mg/dL (ref 0.00–0.40)
Total Protein: 6.6 g/dL (ref 6.0–8.5)

## 2020-05-03 LAB — BASIC METABOLIC PANEL
BUN/Creatinine Ratio: 19 (ref 9–20)
BUN: 16 mg/dL (ref 6–24)
CO2: 25 mmol/L (ref 20–29)
Calcium: 9.4 mg/dL (ref 8.7–10.2)
Chloride: 103 mmol/L (ref 96–106)
Creatinine, Ser: 0.83 mg/dL (ref 0.76–1.27)
GFR calc Af Amer: 112 mL/min/{1.73_m2} (ref >59)
GFR calc non Af Amer: 97 mL/min/{1.73_m2} (ref >59)
Glucose: 78 mg/dL (ref 65–99)
Potassium: 4.2 mmol/L (ref 3.5–5.2)
Sodium: 140 mmol/L (ref 134–144)

## 2020-05-03 LAB — HEMOGLOBIN A1C
Est. average glucose Bld gHb Est-mCnc: 171 mg/dL
Hgb A1c MFr Bld: 7.6 % — ABNORMAL HIGH (ref 4.8–5.6)

## 2020-11-21 ENCOUNTER — Emergency Department (HOSPITAL_COMMUNITY): Payer: 59

## 2020-11-21 ENCOUNTER — Encounter (HOSPITAL_COMMUNITY): Payer: Self-pay

## 2020-11-21 ENCOUNTER — Other Ambulatory Visit: Payer: Self-pay

## 2020-11-21 ENCOUNTER — Inpatient Hospital Stay (HOSPITAL_COMMUNITY): Payer: 59

## 2020-11-21 ENCOUNTER — Inpatient Hospital Stay (HOSPITAL_COMMUNITY)
Admission: EM | Admit: 2020-11-21 | Discharge: 2020-11-23 | DRG: 065 | Disposition: A | Discharge status: Home / Self Care | Payer: 59 | Attending: Internal Medicine | Admitting: Internal Medicine

## 2020-11-21 DIAGNOSIS — R569 Unspecified convulsions: Principal | ICD-10-CM

## 2020-11-21 DIAGNOSIS — R4189 Other symptoms and signs involving cognitive functions and awareness: Secondary | ICD-10-CM | POA: Diagnosis present

## 2020-11-21 DIAGNOSIS — R4182 Altered mental status, unspecified: Secondary | ICD-10-CM

## 2020-11-21 DIAGNOSIS — I6330 Cerebral infarction due to thrombosis of unspecified cerebral artery: Secondary | ICD-10-CM

## 2020-11-21 DIAGNOSIS — H02401 Unspecified ptosis of right eyelid: Secondary | ICD-10-CM | POA: Diagnosis present

## 2020-11-21 DIAGNOSIS — E119 Type 2 diabetes mellitus without complications: Secondary | ICD-10-CM | POA: Diagnosis present

## 2020-11-21 DIAGNOSIS — Z7984 Long term (current) use of oral hypoglycemic drugs: Secondary | ICD-10-CM

## 2020-11-21 DIAGNOSIS — E538 Deficiency of other specified B group vitamins: Secondary | ICD-10-CM | POA: Diagnosis present

## 2020-11-21 DIAGNOSIS — E1165 Type 2 diabetes mellitus with hyperglycemia: Secondary | ICD-10-CM | POA: Diagnosis not present

## 2020-11-21 DIAGNOSIS — F17210 Nicotine dependence, cigarettes, uncomplicated: Secondary | ICD-10-CM | POA: Diagnosis present

## 2020-11-21 DIAGNOSIS — R404 Transient alteration of awareness: Secondary | ICD-10-CM | POA: Diagnosis present

## 2020-11-21 DIAGNOSIS — K219 Gastro-esophageal reflux disease without esophagitis: Secondary | ICD-10-CM | POA: Diagnosis present

## 2020-11-21 DIAGNOSIS — R81 Glycosuria: Secondary | ICD-10-CM | POA: Diagnosis present

## 2020-11-21 DIAGNOSIS — Z7982 Long term (current) use of aspirin: Secondary | ICD-10-CM | POA: Diagnosis not present

## 2020-11-21 DIAGNOSIS — Z20822 Contact with and (suspected) exposure to covid-19: Secondary | ICD-10-CM | POA: Diagnosis present

## 2020-11-21 DIAGNOSIS — G9349 Other encephalopathy: Secondary | ICD-10-CM | POA: Diagnosis present

## 2020-11-21 DIAGNOSIS — I6381 Other cerebral infarction due to occlusion or stenosis of small artery: Principal | ICD-10-CM | POA: Diagnosis present

## 2020-11-21 DIAGNOSIS — I6389 Other cerebral infarction: Secondary | ICD-10-CM | POA: Diagnosis not present

## 2020-11-21 DIAGNOSIS — E785 Hyperlipidemia, unspecified: Secondary | ICD-10-CM | POA: Diagnosis present

## 2020-11-21 DIAGNOSIS — Z79899 Other long term (current) drug therapy: Secondary | ICD-10-CM | POA: Diagnosis not present

## 2020-11-21 DIAGNOSIS — I10 Essential (primary) hypertension: Secondary | ICD-10-CM | POA: Diagnosis present

## 2020-11-21 DIAGNOSIS — E7800 Pure hypercholesterolemia, unspecified: Secondary | ICD-10-CM | POA: Diagnosis not present

## 2020-11-21 HISTORY — DX: Type 2 diabetes mellitus without complications: E11.9

## 2020-11-21 HISTORY — DX: Essential (primary) hypertension: I10

## 2020-11-21 LAB — URINALYSIS, ROUTINE W REFLEX MICROSCOPIC
Bacteria, UA: NONE SEEN
Bilirubin Urine: NEGATIVE
Glucose, UA: >=500 mg/dL — AB
Hgb urine dipstick: NEGATIVE
Ketones, ur: NEGATIVE mg/dL
Leukocytes,Ua: NEGATIVE
Nitrite: NEGATIVE
Protein, ur: NEGATIVE mg/dL
Specific Gravity, Urine: 1.028 (ref 1.005–1.030)
pH: 6 (ref 5.0–8.0)

## 2020-11-21 LAB — COOXEMETRY PANEL
Carboxyhemoglobin: 0.9 % (ref 0.5–1.5)
Methemoglobin: 0.8 % (ref 0.0–1.5)
O2 Saturation: 77.3 %
Total hemoglobin: 14.3 g/dL (ref 12.0–16.0)

## 2020-11-21 LAB — AMMONIA: Ammonia: 35 umol/L (ref 9–35)

## 2020-11-21 LAB — COMPREHENSIVE METABOLIC PANEL
ALT: 29 U/L (ref 0–44)
AST: 26 U/L (ref 15–41)
Albumin: 3.7 g/dL (ref 3.5–5.0)
Alkaline Phosphatase: 48 U/L (ref 38–126)
Anion gap: 8 (ref 5–15)
BUN: 15 mg/dL (ref 6–20)
CO2: 26 mmol/L (ref 22–32)
Calcium: 9.1 mg/dL (ref 8.9–10.3)
Chloride: 105 mmol/L (ref 98–111)
Creatinine, Ser: 0.89 mg/dL (ref 0.61–1.24)
GFR, Estimated: >60 mL/min (ref >60)
Glucose, Bld: 118 mg/dL — ABNORMAL HIGH (ref 70–99)
Potassium: 4 mmol/L (ref 3.5–5.1)
Sodium: 139 mmol/L (ref 135–145)
Total Bilirubin: 0.8 mg/dL (ref 0.3–1.2)
Total Protein: 5.9 g/dL — ABNORMAL LOW (ref 6.5–8.1)

## 2020-11-21 LAB — CBC WITH DIFFERENTIAL/PLATELET
Abs Immature Granulocytes: 0.06 10*3/uL (ref 0.00–0.07)
Basophils Absolute: 0 10*3/uL (ref 0.0–0.1)
Basophils Relative: 0 %
Eosinophils Absolute: 0.3 10*3/uL (ref 0.0–0.5)
Eosinophils Relative: 4 %
HCT: 41.6 % (ref 39.0–52.0)
Hemoglobin: 14.1 g/dL (ref 13.0–17.0)
Immature Granulocytes: 1 %
Lymphocytes Relative: 17 %
Lymphs Abs: 1.6 10*3/uL (ref 0.7–4.0)
MCH: 30.7 pg (ref 26.0–34.0)
MCHC: 33.9 g/dL (ref 30.0–36.0)
MCV: 90.4 fL (ref 80.0–100.0)
Monocytes Absolute: 0.7 10*3/uL (ref 0.1–1.0)
Monocytes Relative: 8 %
Neutro Abs: 6.3 10*3/uL (ref 1.7–7.7)
Neutrophils Relative %: 70 %
Platelets: 275 10*3/uL (ref 150–400)
RBC: 4.6 MIL/uL (ref 4.22–5.81)
RDW: 12.3 % (ref 11.5–15.5)
WBC: 8.9 10*3/uL (ref 4.0–10.5)
nRBC: 0 % (ref 0.0–0.2)

## 2020-11-21 LAB — TSH: TSH: 0.653 u[IU]/mL (ref 0.350–4.500)

## 2020-11-21 LAB — FOLATE: Folate: 15.2 ng/mL (ref >5.9)

## 2020-11-21 LAB — HEMOGLOBIN A1C
Hgb A1c MFr Bld: 7.6 % — ABNORMAL HIGH (ref 4.8–5.6)
Mean Plasma Glucose: 171.42 mg/dL

## 2020-11-21 LAB — RAPID URINE DRUG SCREEN, HOSP PERFORMED
Amphetamines: NOT DETECTED
Barbiturates: NOT DETECTED
Benzodiazepines: POSITIVE — AB
Cocaine: NOT DETECTED
Opiates: NOT DETECTED
Tetrahydrocannabinol: NOT DETECTED

## 2020-11-21 LAB — CBG MONITORING, ED
Glucose-Capillary: 81 mg/dL (ref 70–99)
Glucose-Capillary: 63 mg/dL — ABNORMAL LOW (ref 70–99)
Glucose-Capillary: 103 mg/dL — ABNORMAL HIGH (ref 70–99)

## 2020-11-21 LAB — HIV ANTIBODY (ROUTINE TESTING W REFLEX): HIV Screen 4th Generation wRfx: NONREACTIVE

## 2020-11-21 LAB — SARS CORONAVIRUS 2 (TAT 6-24 HRS): SARS Coronavirus 2: NEGATIVE

## 2020-11-21 LAB — VITAMIN B12: Vitamin B-12: 211 pg/mL (ref 180–914)

## 2020-11-21 LAB — ETHANOL: Alcohol, Ethyl (B): <10 mg/dL (ref <10)

## 2020-11-21 MED ORDER — SODIUM CHLORIDE 0.9 % IV SOLN
75.0000 mL/h | INTRAVENOUS | Status: DC
  Administered 2020-11-21 – 2020-11-22 (×2): 75 mL/h via INTRAVENOUS

## 2020-11-21 MED ORDER — ACETAMINOPHEN 325 MG PO TABS: 650.0000 mg | ORAL_TABLET | ORAL | Status: DC | PRN

## 2020-11-21 MED ORDER — DEXTROSE 50 % IV SOLN
INTRAVENOUS | Status: AC
  Administered 2020-11-21: 12.5 g via INTRAVENOUS
  Filled 2020-11-21: qty 50

## 2020-11-21 MED ORDER — DEXTROSE 50 % IV SOLN: 12.5000 g | Freq: Once | INTRAVENOUS | Status: AC

## 2020-11-21 MED ORDER — IOHEXOL 350 MG/ML SOLN
75.0000 mL | Freq: Once | INTRAVENOUS | Status: AC | PRN
  Administered 2020-11-21: 75 mL via INTRAVENOUS

## 2020-11-21 MED ORDER — LACTATED RINGERS IV BOLUS
1000.0000 mL | Freq: Once | INTRAVENOUS | Status: AC
  Administered 2020-11-21: 1000 mL via INTRAVENOUS

## 2020-11-21 MED ORDER — THIAMINE HCL 100 MG/ML IJ SOLN
100.0000 mg | Freq: Every day | INTRAMUSCULAR | Status: DC
  Administered 2020-11-21: 100 mg via INTRAVENOUS
  Filled 2020-11-21: qty 2

## 2020-11-21 MED ORDER — LORAZEPAM 2 MG/ML IJ SOLN
2.0000 mg | INTRAMUSCULAR | Status: DC | PRN
  Filled 2020-11-21 (×2): qty 1

## 2020-11-21 MED ORDER — ACETAMINOPHEN 650 MG RE SUPP: 650.0000 mg | RECTAL | Status: DC | PRN

## 2020-11-21 MED ORDER — ENOXAPARIN SODIUM 40 MG/0.4ML ~~LOC~~ SOLN
40.0000 mg | SUBCUTANEOUS | Status: DC
  Administered 2020-11-21 – 2020-11-23 (×3): 40 mg via SUBCUTANEOUS
  Filled 2020-11-21 (×3): qty 0.4

## 2020-11-21 MED ORDER — LEVETIRACETAM IN NACL 1500 MG/100ML IV SOLN
1500.0000 mg | Freq: Once | INTRAVENOUS | Status: AC
  Administered 2020-11-21: 1500 mg via INTRAVENOUS
  Filled 2020-11-21: qty 100

## 2020-11-21 MED ORDER — INSULIN ASPART 100 UNIT/ML ~~LOC~~ SOLN: 0.0000 [IU] | Freq: Three times a day (TID) | SUBCUTANEOUS | Status: DC

## 2020-11-21 MED ORDER — THIAMINE HCL 100 MG PO TABS: 100.0000 mg | ORAL_TABLET | Freq: Every day | ORAL | Status: DC

## 2020-11-21 MED ORDER — INSULIN ASPART 100 UNIT/ML ~~LOC~~ SOLN
0.0000 [IU] | SUBCUTANEOUS | Status: DC
  Administered 2020-11-22: 5 [IU] via SUBCUTANEOUS
  Administered 2020-11-22: 2 [IU] via SUBCUTANEOUS

## 2020-11-21 MED ORDER — LORAZEPAM 2 MG/ML IJ SOLN
1.0000 mg | Freq: Once | INTRAMUSCULAR | Status: AC
  Administered 2020-11-22: 1 mg via INTRAVENOUS

## 2020-11-21 NOTE — H&P (Signed)
Date: 11/21/2020               Patient Name:  Chad Mcbride MRN: [id number]  DOB: 03/16/1961 Age / Sex: 60 y.o., male   PCP: No primary care provider on file.         Medical Service: Internal Medicine Teaching Service         Attending Physician: Dr. Mayford Knife, Dorene Ar, MD    First Contact: Dr. Laural Benes Pager: [phone]  Second Contact: Dr. Barbaraann Faster Pager: [phone]       After Hours (After 5p/  First Contact Pager: [phone]  weekends / holidays): Second Contact Pager: [phone]   Chief Complaint: Unresponsiveness, seizure like activity  History of Present Illness: This is a 60 year old male with a history of HTN, HLD, DM, and GERD who is presenting with unresponsiveness and multiple episodes of upper and lower extremity tonic like movements. Patient unable to participate in interview, history obtained from chart and patients wife at bedside.   She reports that at around 11 PM last night he was on his computer she went to check on him around midnight and found him slumped over in his chair, almost falling off, he was not responding to her so she called EMS.  Per EMS run sheet he was noted to be nonverbal, not tracking, with nonpurposeful movements.  His vitals are stable and his blood sugar was 142.  He was placed on the stretcher and while at the ER it was documented that he had several partial and tonic-clonic seizures he was given 2 rounds of midazolam and transported to [facility].  Patient reports that he has been doing well, she denies him having any fevers, chills, nausea, headaches, lightheadedness, chest pain, shortness of breath, or any other symptoms.  She denies any history of recreational drug use, or any recent alcohol use.  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  She states he has never had anything similar to this before.  She does state that his sleep is very  deep and states it looks similar to how he is looking now.  On arrival patient was unresponsive with no seizure-like activity.  EDP reported that patient would wake up pain, when he had a in and out cath placed and he sat up a bit. Patient had a episode in the ED and was given Keppra 150 mg 1.  Continued to have persistent encephalopathy  In the ED patient was noted to have a temperature of 96.5, respiratory rate, pulse, blood pressure all stable, blood pressures in the 120s over 90s.  Co-ox showed a hemoglobin of 14, O2 saturation 97, carboxyhemoglobin 0.9, methhemoglobin 0.8.  UA showed glucosuria greater than 500, no ketones, nitrates, or leukocytes.  UDS showed benzodiazepines.  TSH,, and ammonia levels B12 m. CBC unremarkable.  CMP unremarkable, glucose 118.  CT head showed hypodensities at the periventricular white matter, likely chronic small vessel ischemic change.  Chest x-ray showed a small hazy opacity in the right basilar area, atelectasis or bronchovascular crowding is favored. Neurology was consulted, patient is a prior parenteral, recommended CTA head and neck, continuous EEG, additional labs, and MRI brain with and without contrast after EEG.  [facility] was consulted for admission  Meds:  Jardiance 25 mg daily Janumet 50-1000 Omeprazole 40 mg daily Metoprolol 50 mg daily Rosuvastatin 40 mg daily  Patient's wife is unsure if patient is taking the following, these are listed on his other chart Aspirin 81 mg daily Basaglar 21 units daily Flomax 0.4 mg daily  Allergies: Allergies as of 11/21/2020  . (No Known Allergies)   Past Medical History:  Diagnosis Date  . Diabetes mellitus without complication (HCC)   . Hypertension     Family History:  Mother has alzheimers. No history of seizures in family.   Social History: Wife states patient smokes a lot, she is not sure how many cigarettes per day, possibly up to 1 pack/day, will need to clarify with patient when more awake.  She  denies any recent alcohol use, denies any recreational drug use.  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  Moved from [place] approximately 40 years ago, moved to [place] about 18 years ago.  Lives with her wife and son.  Review of Systems: A complete ROS was negative except as per HPI.   Physical Exam: Blood pressure (!) 124/95, pulse 71, temperature (!) 96.5 F (35.8 C), temperature source Rectal, resp. rate 14, SpO2 100 %. Physical Exam Constitutional:      Appearance: He is normal weight.     Comments: Snoring, not responding to voice, no obvious distress noted  HENT:     Head: Normocephalic and atraumatic.     Comments: EEG wires in place    Nose: Nose normal.     Mouth/Throat:     Mouth: Mucous membranes are moist.     Pharynx: Oropharynx is clear.  Eyes:     General:        Right eye: No discharge.        Left eye: No discharge.     Conjunctiva/sclera: Conjunctivae normal.     Comments: Minimally reactive pupillary reflex, equal in size  Cardiovascular:     Rate and Rhythm: Normal rate and regular rhythm.  Pulmonary:     Effort: Pulmonary effort is normal.     Breath sounds: Normal breath sounds.  Abdominal:     General: Abdomen is flat. Bowel sounds are normal.     Palpations: Abdomen is soft.     Tenderness: There is no abdominal tenderness.  Musculoskeletal:        General: No swelling or deformity.     Cervical back: Neck supple.  Skin:    General: Skin is warm and dry.     Capillary Refill: Capillary refill takes less than 2 seconds.  Neurological:     Mental Status: He is lethargic.     GCS: GCS eye subscore is 1. GCS verbal subscore is 1. GCS motor subscore is 5.     Comments: Withdraws from pain, moves upper and lower extremities. Positive corneal reflex, grimaces and closes eye when attempting to exam. Reflexes in upper and lower extremities intact and equal.     EKG:  personally reviewed my interpretation is normal sinus rhythm, heart rate 71, axis, no acute ST changes  CXR: personally reviewed my interpretation is right basilar opacity, decreased lung fields, no pleural effusion  Assessment & Plan by Problem: Active Problems:   Recurrent episodes of unresponsiveness  Episode of unresponsiveness:  Seizure-like activity: Patient presenting with this food unresponsive that occurred around 11 PM last night.  He had no trauma, no changes to his medication, alcohol or recreational drug use, or other precipitating factors.  Wife states patient was in his  normal state of health prior to this episode.  He was noted to tonic-clonic activity which was concerning for seizures, he was treated with IV Versed by EMS and brought to the hospital.  While in the ED he was found to have an additional episode and treated with Keppra, 1500 mg once.  Patient is currently very lethargic and obtunded, does withdraw to painful stimuli, corneal reflex is intact, pupils are reactive.  His CBC and CMP have been relatively unremarkable.  CT head showed no evidence of intracranial bleed or acute intracranial process.  CT cervical spine showed no acute fracture.  Patient is currently protecting his airway, his vitals are stable, however given his mental status and GCS score will monitor closely for need for intubation.  Could be related to new onset seizures, substance abuse, medication induced, acute CVa or parasomnias.  No significant metabolic abnormalities have been noted thus far.  UDS was negative.  Stat EEG showed no evidence of seizure activity, has excessive generalized beta activity is most likely due to benzodiazepines.   -Neurology is following, appreciate recommendations  -Follow-up CTA head and neck -Follow-up continuous EEG -Follow-up drug screen, alcohol, folate, B12, TSH, B1 -MRI brain with and without contrast after a continuous EEG -Continue Ativan as needed for  seizure-like activity -Admit to progressive -Seizure precautions  Hypertension: Patient on metoprolol 50 mg daily at home.  Blood pressures have been stable while admitted.  Unable to take p.o. at this time.  -Hold home metoprolol 50 mg daily -Consider IV metoprolol if BP becomes uncontrolled  Diabetes: Patient is on Jardiance 25 mg daily and Janumet 50-1000 daily at home.  Likely on Basaglar 21 units daily however wife was unsure and patient unable to cooperate.  His blood sugar today has been stable around 118, does have a greater than 500 glucosurea.  Patient is currently n.p.o. in setting of his current mental status.  We will place on sliding scale insulin.  -Frequent CBGs -SSI  Hyperlipidemia: Patient on atorvastatin 40 mg daily at home.  We will hold for now, can resume once mental status improves and diet is started.  -Hold home medication in setting of n.p.o. status  GERD: Patient on omeprazole 40 mg daily at home.  -Hold home medication in setting of n.p.o. status  Dispo: Admit patient to Inpatient with expected length of stay greater than 2 midnights.  Signed: Claudean Severance, MD 11/21/2020, 9:41 AM  Pager: [phone] After 5pm on weekdays and 1pm on weekends: On Call pager: [phone]

## 2020-11-21 NOTE — Progress Notes (Signed)
RN received call that patient was actively removing EEG sensors. As RN entered room, patient removed all EEG sensors, telemetry electrodes, and an  IV while attempted to get out of bed in attempts to get to rest room. RN attempted to notify EEG techs x3 via phone with no success. RN pages neurologist and plans to update neuro team when page is answered. Patient is currently sleeping

## 2020-11-21 NOTE — Progress Notes (Signed)
STAT EEG completed; Dr Melynda Ripple notified, results pending.

## 2020-11-21 NOTE — Consult Note (Signed)
NEUROLOGY CONSULTATION NOTE   Date of service: November 21, 2020 Patient Name: Chad Mcbride MRN:  [id number] DOB:  February 28, 1961 Reason for consult: "unresponsive, concern for seizures" _ _ _   _ __   _ __ _ _  __ __   _ __   __ _  History of Present Illness  Chad Mcbride is a 60 y.o. male with PMH significant for DM2, HTN, HLD who presents with unresponsiveness. Patient is unable to provide any meaningful history.   Most of the history obtained from patient's wife over phone (Ms. Donna Bernard) by chart review and discussion by EDP.  Wife reports that he was doing fine over the last few days. No fever, no chills, nothing out of the ordinary. Usually is someone who falls asleep very easily, even while sitting but easy to wake up. She was him at 2100 on 11/20/20 and he was fine. She then found him at midnight sleeping on his computer chair. She tried to wake him up but he would not wake up no matter how hard she tried. No new medications apart from his diabetes meds, does not drink alcohol. No history of seizures, no history of significant head injury with loss of consciousness. No starring off episodes. Smokes a lot now. Recently stopped working about a month ago. His mom has dementia, no prior history of stroke.  She eventually called EMS. He was noted by EMS to have episodes of ?posturing upto the side by EMS and given 2.5mg  of Versed x 2. He had one episode here and was given Keppra load of 1500mg  once.  He was observed in the ED with persistent encephalopathy and no change in mentation and thus we were consulted for further evaluation.    ROS   Unable to obtain due to encephalopathy.  Past History   Past Medical History:  Diagnosis Date  . Diabetes mellitus without complication (HCC)   . Hypertension    History reviewed. No pertinent surgical history. No family history on file. Social History   Socioeconomic History  . Marital status: Unknown    Spouse name: Not on file  . Number of  children: Not on file  . Years of education: Not on file  . Highest education level: Not on file  Occupational History  . Not on file  Tobacco Use  . Smoking status: Not on file  . Smokeless tobacco: Not on file  Substance and Sexual Activity  . Alcohol use: Not on file  . Drug use: Not on file  . Sexual activity: Not on file  Other Topics Concern  . Not on file  Social History Narrative  . Not on file   Social Determinants of Health   Financial Resource Strain: Not on file  Food Insecurity: Not on file  Transportation Needs: Not on file  Physical Activity: Not on file  Stress: Not on file  Social Connections: Not on file   No Known Allergies  Medications  (Not in a hospital admission)    Vitals   Vitals:   11/21/20 0100 11/21/20 0115 11/21/20 0130 11/21/20 0300  BP: 124/89 117/89 132/90 126/90  Pulse: 70 65 66 68  Resp: 15 17 14 13   Temp:      TempSrc:      SpO2: 100% 100% 100% 98%     There is no height or weight on file to calculate BMI.  Physical Exam   General: Laying comfortably in bed; in no acute distress. Snoring. HENT: Normal oropharynx  and mucosa. Normal external appearance of ears and nose. Neck: Supple, no pain or tenderness CV: No JVD. No peripheral edema. Pulmonary: Symmetric Chest rise. Normal respiratory effort. Abdomen: Soft to touch, non-tender. Ext: No cyanosis, edema, or deformity Skin: No rash. Normal palpation of skin.  Musculoskeletal: Normal digits and nails by inspection. No clubbing.  Neurologic Examination  Mental status/Cognition: No response to voice or loud clap. Grimaces and moans to sternal rub and to any noxious stimuli. Moves head left and right to nares stimulation and brings both hands closer to nose.  Speech/language: moans to noxious stimuli.  Pupils: 48mm, both round and reactive to light. Corneals: Positive. Cough and Gag: Bites down hard on tongue depressor. No neck rigidity, negative kernigs and brudzinskis  sign.  Motor:  Localizes in all extremities to pain and grimaces and moans to noxious stimuli in all extremities.  Reflexes:  Right Left Comments  Pectoralis      Biceps (C5/6) 2 2   Brachioradialis (C5/6) 2 2    Triceps (C6/7) 2 2    Patellar (L3/4) 2 2    Achilles (S1)      Hoffman      Plantar     Jaw jerk    Labs   CBC:  Recent Labs  Lab 11/21/20 0048  WBC 8.9  NEUTROABS 6.3  HGB 14.1  HCT 41.6  MCV 90.4  PLT 275    Basic Metabolic Panel:  Lab Results  Component Value Date   NA 139 11/21/2020   K 4.0 11/21/2020   CO2 26 11/21/2020   GLUCOSE 118 (H) 11/21/2020   BUN 15 11/21/2020   CREATININE 0.89 11/21/2020   CALCIUM 9.1 11/21/2020   GFRNONAA >60 11/21/2020   Lipid Panel: No results found for: LDLCALC HgbA1c: No results found for: HGBA1C Urine Drug Screen: No results found for: LABOPIA, COCAINSCRNUR, LABBENZ, AMPHETMU, THCU, LABBARB  Alcohol Level No results found for: [facility]  CT Head without contrast: 1. Hypodensities throughout the periventricular white matter, likely representing chronic small vessel ischemic change. 2. No acute infarct or hemorrhage.  CT C spine without contrast: 1. No acute cervical spine fracture. 2. Mild cervical spondylosis as above.  MRI Brain: pending.  Impression   Chad Mcbride is a 60 y.o. male with PMH significant for DM2, HTN who presents with unresponsiveness at home s/p episodes of ?posturing by EMS and given Versed 2.5mg  x 2 and loaded with Keppra for another episode here. His neurologic examination is notable for obtunded mentation and moans to any noxious stimuli with vigorous head movements, intact brainstem reflexes, snoring, localizes in all extremities. CTH w/o contrast with no acute abnormalities.  No prior hx of seizures, no significant EtOH use, no meningismus, no recreational substance use, CBC and chemistry with no significant abnormalities, glucose normal.   Differential is broad but includes new onset  seizures with pos ictal somnolence, subclinical seizures, substance abuse, artery of percheron stroke, basilar thrombus, parasomnias. less likely infectious, autoimmune nutritional causes of symptoms. I do think that Versed might also be contributing to this.  Recommendations  - CT angio head and neck - cEEG - unfortunately tech on call is 2 hours away and AM team arrives in 1.5 hours. - Labs with serum drug screen, EtOh, folate, B12 with MMA, TSH, B1 with thiamine replacement. - MRI Brain with and without contrast after cEEG.   ______________________________________________________________________   Thank you for the opportunity to take part in the care of this patient. If you have any further  questions, please contact the neurology consultation attending.  Signed,  Erick Blinks [facility] Pager Number [phone] _ _ _   _ __   _ __ _ _  __ __   _ __   __ _

## 2020-11-21 NOTE — Progress Notes (Signed)
Order for sitter in place but no available sitters. Patient's brother is available and at bedside at patient is redirectable.

## 2020-11-21 NOTE — Plan of Care (Signed)

## 2020-11-21 NOTE — Progress Notes (Signed)
LTM EEG hooked up and running - no initial skin breakdown - push button tested - neuro notified. [facility] monitored  MRI compatible leads used. Per MRI Dept note pt moved too much for MRI, another attempt will be made later.

## 2020-11-21 NOTE — Progress Notes (Signed)
Attempted to dp pt MRI but pt constantly moving. He seems to be asleep and unaware of what he is doing. We tried getting him to lay on his back, he did so for a few seconds then moved to his side. He then moved again and crossed his legs. Called nurse to make aware but there are no meds to be given at this time. Pt will be sent back and attempted later.

## 2020-11-21 NOTE — Progress Notes (Signed)
Brief Neurology F/u Note  Please see consult note from Dr. Derry Lory early today. Patient re-examined by me and remains lethargic and disoriented. Spot EEG this AM showed no PDR, + sleep architecture, no IID or sz, excess beta. EEG was unhooked. I have ordered a brain MRI wwo and placed LTM order for patient to be reconnected to cEEG after MRI imaging is completed. Labs resulted thus far reviewed and are unrevealing. If imaging and EEG do not clarify etiology of his presentation consider LP tomorrow.  Bing Neighbors, MD [facility] [phone]  If 7pm- 7am, please page neurology on call as listed in AMION.

## 2020-11-21 NOTE — ED Notes (Signed)
Patient transported to CT 

## 2020-11-21 NOTE — Hospital Course (Signed)
Admitted 11/21/2020  Allergies: Patient has no known allergies. Pertinent Hx: DM, HTN, HLD, tobacco use  60 y.o. male p/w episodes of unresponsiveness, ?seizure like activity  * Unresponsiveness, ?seizure: Given versed and keppra while here. CT head negative. Labs and vitals stable. Withdraws to pain. Protecting airway. Neuro following. CTA, eeg, encepholopathy work up, MRI brain after cEEG.   Consults: Neuro  Meds: Ativan PRN VTE ppx: Lovenox IVF: NS Diet: NPO

## 2020-11-21 NOTE — ED Triage Notes (Signed)
Pt comes via [facility] from home, pt found by wife unresponsive, upon EMS pt began to have tonic clonic seizures with no hx, given total of 5mg  of versed. Pt remains post ictal

## 2020-11-21 NOTE — Procedures (Signed)
Patient Name: Chad Mcbride  MRN: [id number]  Epilepsy Attending: Charlsie Quest  Referring Physician/Provider: Dr Erick Blinks Date: 11/21/2020  Duration: 22.11 mins  Patient history: 60 y.o. male with PMH significant for DM2, HTN who presents with unresponsiveness at home s/p episodes of ?posturing by EMS and given Versed 2.5mg  x 2 and loaded with Keppra for another episode here. EEG to evaluate for seizure  Level of alertness: awake, asleep  AEDs during EEG study: Ativan, LEV  Technical aspects: This EEG study was done with scalp electrodes positioned according to the 10-20 International system of electrode placement. Electrical activity was acquired at a sampling rate of 500Hz  and reviewed with a high frequency filter of 70Hz  and a low frequency filter of 1Hz . EEG data were recorded continuously and digitally stored.   Description: No posterior dominant rhythm was seen. Sleep was characterized by vertex waves, sleep spindles (12 to 14 Hz), maximal frontocentral region.  There is an excessive amount of 15 to 18 Hz, 2-3 uV beta activity with irregular morphology distributed symmetrically and diffusely.  Hyperventilation and photic stimulation were not performed.     ABNORMALITY - Excessive beta, generalized  IMPRESSION: This study is within normal limits. No seizures or epileptiform discharges were seen throughout the recording. The excessive beta activity seen in the background is most likely due to the effect of benzodiazepine and is a benign EEG pattern.   Maryclare Nydam 

## 2020-11-21 NOTE — ED Provider Notes (Signed)
[facility]ad Mcbr[facility] EMERGENCY DEPARTMENT Provider Note   CSN: [id number] Arrival date & time: 11/21/20  0041     History Chief Complaint  Patient presents with  . Seizures    Chad Mcbride is a 60 y.o. male.  Patient working at his computer.  15 minutes later found by his wife unresponsive on the floor.  EMS was called.  They checked the blood sugar which was in the 140s.  Rest of vital signs are within normal limits.  They were transported into the truck and the patient had an episode of a tonic-clonic seizure that consisted of bilateral upper and lower extremity tonic movements.  Persistent unresponsiveness.  Had a couple more episodes of tonic-clonic activity so given 2.5 mg of Versed x2.  Arrives here unresponsive but not with seizures.  EMS reports pupils were 3 mm.  No reported alcohol, drug or tobacco use.  No reported recent illnesses.  No reported history of seizures.  No reported history of intracranial abnormalities   Seizures      Past Medical History:  Diagnosis Date  . Diabetes mellitus without complication (HCC)   . Hypertension     There are no problems to display for this patient.   History reviewed. No pertinent surgical history.     No family history on file.     Home Medications Prior to Admission medications   Not on File    Allergies    Patient has no known allergies.  Review of Systems   Review of Systems  Unable to perform ROS: Patient unresponsive  Neurological: Positive for seizures.    Physical Exam Updated Vital Signs There were no vitals taken for this visit.  Physical Exam Vitals and nursing note reviewed.  Constitutional:      Appearance: He is well-developed.  HENT:     Head: Normocephalic and atraumatic.     Mouth/Throat:     Mouth: Mucous membranes are moist.     Pharynx: Oropharynx is clear.  Eyes:     Pupils: Pupils are equal, round, and reactive to light.  Cardiovascular:     Rate and Rhythm:  Tachycardia present.  Pulmonary:     Effort: Pulmonary effort is normal. No respiratory distress.  Abdominal:     General: Abdomen is flat. There is no distension.  Musculoskeletal:        General: Normal range of motion.     Cervical back: Normal range of motion.  Skin:    General: Skin is warm and dry.  Neurological:     General: No focal deficit present.     Mental Status: He is alert.     GCS: GCS eye subscore is 2. GCS verbal subscore is 1. GCS motor subscore is 4.     Comments: Doesn't respond to commands Withdraws to pain in all extremities     ED Results / Procedures / Treatments   Labs (all labs ordered are listed, but only abnormal results are displayed) Labs Reviewed  CBC WITH DIFFERENTIAL/PLATELET  COMPREHENSIVE METABOLIC PANEL  URINALYSIS, ROUTINE W REFLEX MICROSCOPIC  RAPID URINE DRUG SCREEN, HOSP PERFORMED    EKG None  Radiology No results found.  Procedures .Critical Care Performed by: Marily Memos, MD Authorized by: Marily Memos, MD   Critical care provider statement:    Critical care time (minutes):  45   Critical care was necessary to treat or prevent imminent or life-threatening deterioration of the following conditions:  CNS failure or compromise   Critical care  was time spent personally by me on the following activities:  Discussions with consultants, evaluation of patient's response to treatment, examination of patient, ordering and performing treatments and interventions, ordering and review of laboratory studies, ordering and review of radiographic studies, pulse oximetry, re-evaluation of patient's condition, obtaining history from patient or surrogate and review of old charts   Medications Ordered in ED Medications  lactated ringers bolus 1,000 mL (has no administration in time range)  levETIRAcetam (KEPPRA) IVPB 1500 mg/ 100 mL premix (has no administration in time range)  LORazepam (ATIVAN) injection 2 mg (has no administration in time  range)    ED Course  I have reviewed the triage vital signs and the nursing notes.  Pertinent labs & imaging results that were available during my care of the patient were reviewed by me and considered in my medical decision making (see chart for details).    MDM Rules/Calculators/A&P                         Patient with multiple episodes of seizure-like activity and unresponsiveness.  No history of the same.  We will go and get a head CT to evaluate for any intracranial causes.  Also consider syncope however EMS gives a pretty clear picture of a tonic-clonic movements.  Will work-up accordingly.  An outpatient is protecting his airway no indication for intubation.  His GCS is around 7 withdrawn and opening eyes to pain but minimally.  Does seem to be moving all extremities with that withdrawal to pain.  Difficult to ascertain a full neurologic exam otherwise Neurology consulted.  Recommended CTA followed by EEG followed by MRIs.  Agree with rest of treatment.  Discussed with medicine for admission.  Final Clinical Impression(s) / ED Diagnoses Final diagnoses:  Seizure-like activity (HCC)  Altered mental status, unspecified altered mental status type    Rx / DC Orders ED Discharge Orders    None       , Barbara Cower, MD 11/22/20 [phone]

## 2020-11-22 ENCOUNTER — Inpatient Hospital Stay (HOSPITAL_COMMUNITY): Payer: 59

## 2020-11-22 DIAGNOSIS — I6330 Cerebral infarction due to thrombosis of unspecified cerebral artery: Secondary | ICD-10-CM | POA: Insufficient documentation

## 2020-11-22 DIAGNOSIS — E7800 Pure hypercholesterolemia, unspecified: Secondary | ICD-10-CM

## 2020-11-22 DIAGNOSIS — E1165 Type 2 diabetes mellitus with hyperglycemia: Secondary | ICD-10-CM

## 2020-11-22 DIAGNOSIS — R569 Unspecified convulsions: Secondary | ICD-10-CM

## 2020-11-22 DIAGNOSIS — I6389 Other cerebral infarction: Secondary | ICD-10-CM

## 2020-11-22 LAB — CBC
HCT: 44.9 % (ref 39.0–52.0)
Hemoglobin: 14.8 g/dL (ref 13.0–17.0)
MCH: 29.9 pg (ref 26.0–34.0)
MCHC: 33 g/dL (ref 30.0–36.0)
MCV: 90.7 fL (ref 80.0–100.0)
Platelets: 259 10*3/uL (ref 150–400)
RBC: 4.95 MIL/uL (ref 4.22–5.81)
RDW: 12.2 % (ref 11.5–15.5)
WBC: 7.3 10*3/uL (ref 4.0–10.5)
nRBC: 0 % (ref 0.0–0.2)

## 2020-11-22 LAB — BASIC METABOLIC PANEL
Anion gap: 10 (ref 5–15)
BUN: 17 mg/dL (ref 6–20)
CO2: 20 mmol/L — ABNORMAL LOW (ref 22–32)
Calcium: 8.7 mg/dL — ABNORMAL LOW (ref 8.9–10.3)
Chloride: 110 mmol/L (ref 98–111)
Creatinine, Ser: 0.9 mg/dL (ref 0.61–1.24)
GFR, Estimated: >60 mL/min (ref >60)
Glucose, Bld: 72 mg/dL (ref 70–99)
Potassium: 4.1 mmol/L (ref 3.5–5.1)
Sodium: 140 mmol/L (ref 135–145)

## 2020-11-22 LAB — ECHOCARDIOGRAM COMPLETE BUBBLE STUDY
AR max vel: 2.61 cm2
AV Area VTI: 2.29 cm2
AV Area mean vel: 2.11 cm2
AV Mean grad: 6 mmHg
AV Peak grad: 9.9 mmHg
Ao pk vel: 1.57 m/s
Area-P 1/2: 3.91 cm2
S' Lateral: 2.8 cm

## 2020-11-22 LAB — LDL CHOLESTEROL, DIRECT: Direct LDL: 69.7 mg/dL (ref 0–99)

## 2020-11-22 MED ORDER — DEXTROSE 50 % IV SOLN
INTRAVENOUS | Status: AC
  Administered 2020-11-22: 50 mL
  Filled 2020-11-22: qty 50

## 2020-11-22 MED ORDER — VITAMIN B-12 1000 MCG PO TABS
1000.0000 ug | ORAL_TABLET | Freq: Every day | ORAL | Status: DC
  Administered 2020-11-22 – 2020-11-23 (×2): 1000 ug via ORAL
  Filled 2020-11-22 (×2): qty 1

## 2020-11-22 MED ORDER — THIAMINE HCL 100 MG/ML IJ SOLN: 100.0000 mg | Freq: Every day | INTRAMUSCULAR | Status: DC

## 2020-11-22 MED ORDER — ASPIRIN 81 MG PO CHEW: 81.0000 mg | CHEWABLE_TABLET | Freq: Every day | ORAL | Status: DC

## 2020-11-22 MED ORDER — CLOPIDOGREL BISULFATE 75 MG PO TABS
75.0000 mg | ORAL_TABLET | Freq: Every day | ORAL | Status: DC
  Administered 2020-11-22 – 2020-11-23 (×2): 75 mg via ORAL
  Filled 2020-11-22 (×2): qty 1

## 2020-11-22 MED ORDER — ASPIRIN 300 MG RE SUPP
300.0000 mg | Freq: Every day | RECTAL | Status: DC
  Filled 2020-11-22: qty 1

## 2020-11-22 MED ORDER — THIAMINE HCL 100 MG/ML IJ SOLN
500.0000 mg | Freq: Three times a day (TID) | INTRAVENOUS | Status: DC
  Administered 2020-11-22 – 2020-11-23 (×4): 500 mg via INTRAVENOUS
  Filled 2020-11-22 (×6): qty 5

## 2020-11-22 MED ORDER — GADOBUTROL 1 MMOL/ML IV SOLN
6.5000 mL | Freq: Once | INTRAVENOUS | Status: AC | PRN
  Administered 2020-11-22: 6.5 mL via INTRAVENOUS

## 2020-11-22 MED ORDER — THIAMINE HCL 100 MG/ML IJ SOLN: 250.0000 mg | Freq: Every day | INTRAVENOUS | Status: DC

## 2020-11-22 MED ORDER — ASPIRIN 300 MG RE SUPP: 150.0000 mg | Freq: Every day | RECTAL | Status: DC

## 2020-11-22 MED ORDER — ASPIRIN 81 MG PO CHEW
81.0000 mg | CHEWABLE_TABLET | Freq: Every day | ORAL | Status: DC
  Administered 2020-11-22 – 2020-11-23 (×2): 81 mg via ORAL
  Filled 2020-11-22 (×2): qty 1

## 2020-11-22 MED ORDER — ROSUVASTATIN CALCIUM 20 MG PO TABS
40.0000 mg | ORAL_TABLET | Freq: Every day | ORAL | Status: DC
Start: 2020-11-22 — End: 2020-11-23
  Administered 2020-11-22: 40 mg via ORAL
  Filled 2020-11-22: qty 2

## 2020-11-22 NOTE — Procedures (Signed)
Patient Name: Chad Mcbride  MRN: [id number]  Epilepsy Attending: Charlsie Quest  Referring Physician/Provider: Dr Erick Blinks Date: 11/21/2020  Duration: 11/21/2020 2240 to 11/21/2020 2304  Patient history: 60 y.o.malewith PMH significant for DM2, HTN who presents with unresponsiveness at home s/p episodes of ?posturing by EMS and given Versed 2.5mg  x 2 and loaded with Keppra for another episode here. EEG to evaluate for seizure  Level of alertness: awake, asleep  AEDs during EEG study: Ativan, LEV  Technical aspects: This EEG study was done with scalp electrodes positioned according to the 10-20 International system of electrode placement. Electrical activity was acquired at a sampling rate of 500Hz  and reviewed with a high frequency filter of 70Hz  and a low frequency filter of 1Hz . EEG data were recorded continuously and digitally stored.   Description: No posterior dominant rhythm was seen. Sleep was characterized by  sleep spindles (12 to 14 Hz), maximal frontocentral region. Hyperventilation and photic stimulation were not performed.    Patient pulled electrodes after 2300 and rest of the study was not interpretable.  IMPRESSION: This study is within normal limits. No seizures or epileptiform discharges were seen throughout the recording.   [name] 

## 2020-11-22 NOTE — Progress Notes (Signed)
Subjective:   Overnight, patient underwent MRI and continuous EEG.  This morning, IMTS met with patient and patient's sister-in-law at bedside. He understands that he has had a stroke requiring further workup during this admission. When asked why he came to the hospital, he began talking about an interaction with a woman which may have been the admitting physician. After reviewing the results of his MRI and conducting a physical examination, patient began to talk about a cow and a wolf.  Objective:  Vital signs in last 24 hours: Vitals:   11/21/20 2049 11/22/20 0016 11/22/20 0331 11/22/20 0746  BP: (!) 84/60 120/81 116/79 109/78  Pulse: 72 70 74 85  Resp: 18 16 18 16   Temp: 98 F (36.7 C) 98.8 F (37.1 C) 98.4 F (36.9 C) 98.4 F (36.9 C)  TempSrc: Axillary Axillary Axillary Axillary  SpO2: 94% 99% 100% 100%  Weight:   63.8 kg   On room air  Intake/Output Summary (Last 24 hours) at 11/22/2020 1154 Last data filed at 11/22/2020 0924 Gross per 24 hour  Intake 0 ml  Output 600 ml  Net -600 ml   Filed Weights   11/21/20 1537 11/22/20 0331  Weight: 63.5 kg 63.8 kg  Physical Exam Vitals reviewed. Exam conducted with a chaperone present.  Constitutional:      General: He is not in acute distress.    Appearance: He is not ill-appearing.     Comments: Middle-aged man supine in hospital bed, frequently moving lower extremities and left arm with right eye closed. Brief periods of somnolence  HENT:     Head: Normocephalic and atraumatic.     Mouth/Throat:     Mouth: Mucous membranes are moist.     Pharynx: Oropharynx is clear.  Eyes:     Extraocular Movements: Extraocular movements intact.     Conjunctiva/sclera: Conjunctivae normal.     Pupils: Pupils are equal, round, and reactive to light.     Comments: Right ptosis. While performing EOM, patient repeatedly saying "one"  Cardiovascular:     Rate and Rhythm: Normal rate and regular rhythm.     Pulses: Normal pulses.      Heart sounds: Normal heart sounds. No murmur heard.   Pulmonary:     Effort: Pulmonary effort is normal. No respiratory distress.     Breath sounds: Normal breath sounds. No wheezing or rales.  Abdominal:     General: Abdomen is flat. Bowel sounds are normal.     Palpations: Abdomen is soft.     Tenderness: There is no abdominal tenderness.  Musculoskeletal:        General: No swelling. Normal range of motion.     Cervical back: Normal range of motion and neck supple.     Right lower leg: No edema.     Left lower leg: No edema.  Skin:    General: Skin is warm and dry.     Capillary Refill: Capillary refill takes less than 2 seconds.  Neurological:     Mental Status: He is alert.     Cranial Nerves: No cranial nerve deficit.     Sensory: No sensory deficit.     Motor: No weakness.     Coordination: Coordination normal.     Comments: Oriented to person and place. Not oriented to year, reports 2002. Cranial nerves intact except for right ptosis.  Psychiatric:        Mood and Affect: Mood is elated.        Speech:  Speech is tangential.        Behavior: Behavior is hyperactive.        Judgment: Judgment normal.    Labs in last 24 hours: CBC Latest Ref Rng & Units 11/22/2020 11/21/2020  WBC 4.0 - 10.5 K/uL 7.3 8.9  Hemoglobin 13.0 - 17.0 g/dL 14.8 14.1  Hematocrit 39.0 - 52.0 % 44.9 41.6  Platelets 150 - 400 K/uL 259 275   BMP Latest Ref Rng & Units 11/22/2020 11/21/2020  Glucose 70 - 99 mg/dL 72 118(H)  BUN 6 - 20 mg/dL 17 15  Creatinine 0.61 - 1.24 mg/dL 0.90 0.89  Sodium 135 - 145 mmol/L 140 139  Potassium 3.5 - 5.1 mmol/L 4.1 4.0  Chloride 98 - 111 mmol/L 110 105  CO2 22 - 32 mmol/L 20(L) 26  Calcium 8.9 - 10.3 mg/dL 8.7(L) 9.1   Vitamin B1 - in process LDL - 69.7 Hemoglobin A1c - 7.6 Glucose - 103, 63, 81 COVID - negative HIV antibody - negative  Imaging in last 24 hours: MR BRAIN W WO CONTRAST Result Date: 11/22/2020 IMPRESSION: 1. Positive for acute Artery of  Percheron infarction, with abnormal paramedian thalami. Punctate hemorrhage associated on the left. No mass effect. 2. 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 3. Large vessel flow voids are stable to the CTA findings yesterday. 4. Partially visible degenerative cervical spinal stenosis at C4-C5.  Overnight EEG with video Result Date: 11/22/2020 IMPRESSION: This study is within normal limits. No seizures or epileptiform discharges were seen throughout the recording.  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 As noted by neurohospitalist team, artery of percheron is an anatomic variant of thalamic blood supply with occlusion leading to thalamic strokes and subsequent lethargy and altered mental status. Patient underwent continuous EEG monitoring overnight which was unremarkable. Patient's neurological examination only remarkable for right eye ptosis, however his psychiatric examination reveals an elated mood, tangential speech and hyperactive behavior. Unclear whether these findings are attributable to his recent stroke. -Neurohospitalist team transferring consultation to stroke team, appreciate recommendations  -Echocardiogram complete bubble study ordered  -NPO until encephalopathy improves and passes swallow study  -Aspirin 388m suppository daily, transition to aspirin 873mwhen tolerating oral  intake  -Holding plavix in setting of absent PO intake and punctate hemorrhage in left thalamus  -Follow-up vitamin B1, supplement with thaimine 50011mV Q8H  -Avoidance of hypotension  -Cardiac monitoring, will need ambulatory cardiac monitoring following discharge -Safety precautions 1 to 1 observation  #HTN, chronic Patient's blood pressure relatively low while off of home metoprolol 31m87mily. In setting of patient's acute infarction, will refrain from starting antihypertensive at this time. -Holding home metoprolol 31mg59mly -IVF NS at 75cc/hr  #Type 2 diabetes mellitus, chronic Hemoglobin A1c elevated to 7.6. Patient's blood glucose readings within past 24 hours between 63-103 in setting of absent PO intake. -CBG monitoring Q4H -Sensitive SSI 0-9 units Q4H  #Code status: Full code #Diet: NPO #VTE ppx: Enoxaparin 40mg 40my  Prior to  Admission Living Arrangement: Home Anticipated Discharge Location: Home Barriers to Discharge: Continued medical workup Dispo: Anticipated discharge in approximately 3-4 day(s).   Cato Mulligan, MD 11/22/2020, 11:54 AM Pager: [phone] After 5pm on weekdays and 1pm on weekends: On Call pager [phone]

## 2020-11-22 NOTE — Progress Notes (Signed)
  Echocardiogram 2D Echocardiogram has been performed.  Chad Mcbride F 11/22/2020, 6:08 PM

## 2020-11-22 NOTE — Progress Notes (Signed)
STROKE TEAM PROGRESS NOTE   SUBJECTIVE (INTERVAL HISTORY) His wife is at the bedside.  Overall his condition is rapidly improving.  Patient laying in bed, drowsy sleepy but easily arousable, follows simple commands.  No aphasia.  No focal deficit.  He has right mild proptosis, however chronic as per wife.  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   OBJECTIVE Temp:  [97.9 F (36.6 C)-98.8 F (37.1 C)] 97.9 F (36.6 C) (03/19 1648) Pulse Rate:  [70-85] 82 (03/19 1648) Cardiac Rhythm: Normal sinus rhythm (03/19 0814) Resp:  [16-20] 20 (03/19 1648) BP: (84-120)/(60-85) 116/85 (03/19 1648) SpO2:  [94 %-100 %] 100 % (03/19 1648) Weight:  [63.8 kg] 63.8 kg (03/19 0331)  Recent Labs  Lab 11/21/20 1214 11/21/20 1529 11/21/20 1608  GLUCAP 81 63* 103*   Recent Labs  Lab 11/21/20 0048 11/22/20 0346  NA 139 140  K 4.0 4.1  CL 105 110  CO2 26 20*  GLUCOSE 118* 72  BUN 15 17  CREATININE 0.89 0.90  CALCIUM 9.1 8.7*   Recent Labs  Lab 11/21/20 0048  AST 26  ALT 29  ALKPHOS 48  BILITOT 0.8  PROT 5.9*  ALBUMIN 3.7   Recent Labs  Lab 11/21/20 0048 11/22/20 0346  WBC 8.9 7.3  NEUTROABS 6.3  --   HGB 14.1 14.8  HCT 41.6 44.9  MCV 90.4 90.7  PLT 275 259   No results for input(s): CKTOTAL, CKMB, CKMBINDEX, TROPONINI in the last 168 hours. No results for input(s): LABPROT, INR in the last 72 hours. Recent Labs    11/21/20 0613  COLORURINE YELLOW  LABSPEC 1.028  PHURINE 6.0  GLUCOSEU >=500*  HGBUR NEGATIVE  BILIRUBINUR NEGATIVE  KETONESUR NEGATIVE  PROTEINUR NEGATIVE  NITRITE NEGATIVE  LEUKOCYTESUR NEGATIVE    No results found for: CHOL, TRIG, HDL, CHOLHDL, VLDL, LDLCALC Lab Results  Component Value Date   HGBA1C 7.6 (H) 11/21/2020      Component Value Date/Time   LABOPIA NONE DETECTED 11/21/2020 0613   COCAINSCRNUR NONE DETECTED 11/21/2020 0613   LABBENZ POSITIVE (A) 11/21/2020 0613   AMPHETMU NONE  DETECTED 11/21/2020 0613   THCU NONE DETECTED 11/21/2020 0613   LABBARB NONE DETECTED 11/21/2020 0613    Recent Labs  Lab 11/21/20 0442  ETH <10    I have personally reviewed the radiological images below and agree with the radiology interpretations.  CT ANGIO HEAD W OR WO CONTRAST  Result Date: 11/21/2020 CLINICAL DATA:  60 year old male is unresponsive. Seizures. Query basilar artery thrombosis. EXAM: CT ANGIOGRAPHY HEAD AND NECK TECHNIQUE: Multidetector CT imaging of the head and neck was performed using the standard protocol during bolus administration of intravenous contrast. Multiplanar CT image reconstructions and MIPs were obtained to evaluate the vascular anatomy. Carotid stenosis measurements (when applicable) are obtained utilizing NASCET criteria, using the distal internal carotid diameter as the denominator. CONTRAST:  75mL OMNIPAQUE IOHEXOL 350 MG/ML SOLN COMPARISON:  CTA head and cervical spine 0145 hours today. FINDINGS: CT HEAD Brain: Stable non contrast CT appearance of the brain. Patchy hypodensity scattered in the bilateral cerebral white matter and bilateral deep gray nuclei. Superimposed dystrophic calcifications in the basal ganglia. Brainstem appears stable. Partial volume artifacts suspected in the right cerebellum on series 5, image 7, prominent cerebellar sulcus suspected here on coronal series 7, image 50. Calvarium and skull base: No acute osseous abnormality identified. Paranasal sinuses: Visualized paranasal sinuses and mastoids are stable and well pneumatized. Orbits: Visualized orbits and  scalp soft tissues are within normal limits. CTA NECK Skeleton: No acute osseous abnormality identified. Upper chest: Minimally included, negative. Other neck: Retained secretions in the oropharynx. No other acute finding. Aortic arch: Aortic arch not included. Right carotid system: Right CCA origin partially visible and appears normal. Negative right CCA and right carotid bifurcation.  Negative cervical right ICA. Left carotid system: Negative visible left CCA. Negative left carotid bifurcation. Minimal irregularity of the ventral left ICA contour distal to the bulb. But no convincing Fibromuscular Dysplasia (FMD), and otherwise negative. Vertebral arteries: Dominant right vertebral artery. Visible proximal right subclavian artery and origin of the non dominant right vertebral arteries are normal. The right vertebral is diminutive but remains patent to the skull base. Visible proximal left subclavian artery and left vertebral artery origin are normal. The left vertebral right holes the size of the ICAs and is widely patent throughout the neck to the skull base with no atherosclerosis identified. CTA HEAD Posterior circulation: Dominant left vertebral artery supplies the basilar which is patent to the tip without stenosis. Normal SCA and PCA origins. Bilateral AICA origins are also patent. Normal left PICA origin. Non dominant right vertebral artery demonstrates proximal V4 segment calcified plaque on series 11, image 156 and appears functionally occluded distal to that. There is reconstitution of the right PICA. Mild irregularity of the left PCA P2 segment. Otherwise the bilateral PCA branches are within normal limits. Anterior circulation: Patent and mildly ectatic left ICA siphon with no atherosclerosis or stenosis. There is a normal left posterior communicating artery. Moderate calcified plaque of the right ICA siphon in the distal cavernous and proximal supraclinoid segments but no stenosis. Patent carotid termini, MCA and ACA origins. Diminutive anterior communicating artery. Bilateral ACA branches are within normal limits. Left MCA M1 segment and bifurcation are widely patent. Right MCA M1 segment and bifurcation are widely patent. Bilateral MCA branches are within normal limits. Venous sinuses: Early contrast timing, but patent. Anatomic variants: Dominant left vertebral artery which  supplies the basilar. Review of the MIP images confirms the above findings IMPRESSION: 1. No emergent large vessel occlusion. Patent basilar artery, supplied by dominant left vertebral artery. 2. But positive for functionally occluded diminutive right Vertebral Artery V4 segment. Right AICA is patent and there is some enhancement of the right PICA. No definite acute right cerebellar infarct on noncontrast head CT. 3. Otherwise very little atherosclerosis in the head and neck. Aortic arch not included. Some underlying arterial ectasia with no other stenosis identified. 4. Stable non contrast CT appearance of the brain since 0145 hours today. Electronically Signed   By: Odessa FlemingH  Hall M.D.   On: 11/21/2020 06:45   CT Head Wo Contrast  Result Date: 11/21/2020 CLINICAL DATA:  Unresponsive, seizures EXAM: CT HEAD WITHOUT CONTRAST TECHNIQUE: Contiguous axial images were obtained from the base of the skull through the vertex without intravenous contrast. COMPARISON:  None. FINDINGS: Brain: Hypodensities are seen within the bilateral periventricular white matter, most pronounced within the right frontal and left parietal regions, compatible with age-indeterminate small vessel ischemic change. Focal hypodensity right basal ganglia consistent with dilated perivascular space or chronic lacunar infarct. No acute hemorrhage. Bilateral basal ganglia calcifications are noted. Lateral ventricles and midline structures are otherwise unremarkable. No acute extra-axial fluid collections. No mass effect. Vascular: No hyperdense vessel or unexpected calcification. Skull: Normal. Negative for fracture or focal lesion. Sinuses/Orbits: No acute finding. Other: None. IMPRESSION: 1. Hypodensities throughout the periventricular white matter, likely representing chronic small vessel ischemic change.  2. No acute infarct or hemorrhage. Electronically Signed   By: Sharlet Salina M.D.   On: 11/21/2020 02:07   CT ANGIO NECK W OR WO  CONTRAST  Result Date: 11/21/2020 CLINICAL DATA:  60 year old male is unresponsive. Seizures. Query basilar artery thrombosis. EXAM: CT ANGIOGRAPHY HEAD AND NECK TECHNIQUE: Multidetector CT imaging of the head and neck was performed using the standard protocol during bolus administration of intravenous contrast. Multiplanar CT image reconstructions and MIPs were obtained to evaluate the vascular anatomy. Carotid stenosis measurements (when applicable) are obtained utilizing NASCET criteria, using the distal internal carotid diameter as the denominator. CONTRAST:  75mL OMNIPAQUE IOHEXOL 350 MG/ML SOLN COMPARISON:  CTA head and cervical spine 0145 hours today. FINDINGS: CT HEAD Brain: Stable non contrast CT appearance of the brain. Patchy hypodensity scattered in the bilateral cerebral white matter and bilateral deep gray nuclei. Superimposed dystrophic calcifications in the basal ganglia. Brainstem appears stable. Partial volume artifacts suspected in the right cerebellum on series 5, image 7, prominent cerebellar sulcus suspected here on coronal series 7, image 50. Calvarium and skull base: No acute osseous abnormality identified. Paranasal sinuses: Visualized paranasal sinuses and mastoids are stable and well pneumatized. Orbits: Visualized orbits and scalp soft tissues are within normal limits. CTA NECK Skeleton: No acute osseous abnormality identified. Upper chest: Minimally included, negative. Other neck: Retained secretions in the oropharynx. No other acute finding. Aortic arch: Aortic arch not included. Right carotid system: Right CCA origin partially visible and appears normal. Negative right CCA and right carotid bifurcation. Negative cervical right ICA. Left carotid system: Negative visible left CCA. Negative left carotid bifurcation. Minimal irregularity of the ventral left ICA contour distal to the bulb. But no convincing Fibromuscular Dysplasia (FMD), and otherwise negative. Vertebral arteries:  Dominant right vertebral artery. Visible proximal right subclavian artery and origin of the non dominant right vertebral arteries are normal. The right vertebral is diminutive but remains patent to the skull base. Visible proximal left subclavian artery and left vertebral artery origin are normal. The left vertebral right holes the size of the ICAs and is widely patent throughout the neck to the skull base with no atherosclerosis identified. CTA HEAD Posterior circulation: Dominant left vertebral artery supplies the basilar which is patent to the tip without stenosis. Normal SCA and PCA origins. Bilateral AICA origins are also patent. Normal left PICA origin. Non dominant right vertebral artery demonstrates proximal V4 segment calcified plaque on series 11, image 156 and appears functionally occluded distal to that. There is reconstitution of the right PICA. Mild irregularity of the left PCA P2 segment. Otherwise the bilateral PCA branches are within normal limits. Anterior circulation: Patent and mildly ectatic left ICA siphon with no atherosclerosis or stenosis. There is a normal left posterior communicating artery. Moderate calcified plaque of the right ICA siphon in the distal cavernous and proximal supraclinoid segments but no stenosis. Patent carotid termini, MCA and ACA origins. Diminutive anterior communicating artery. Bilateral ACA branches are within normal limits. Left MCA M1 segment and bifurcation are widely patent. Right MCA M1 segment and bifurcation are widely patent. Bilateral MCA branches are within normal limits. Venous sinuses: Early contrast timing, but patent. Anatomic variants: Dominant left vertebral artery which supplies the basilar. Review of the MIP images confirms the above findings IMPRESSION: 1. No emergent large vessel occlusion. Patent basilar artery, supplied by dominant left vertebral artery. 2. But positive for functionally occluded diminutive right Vertebral Artery V4 segment.  Right AICA is patent and there is some enhancement  of the right PICA. No definite acute right cerebellar infarct on noncontrast head CT. 3. Otherwise very little atherosclerosis in the head and neck. Aortic arch not included. Some underlying arterial ectasia with no other stenosis identified. 4. Stable non contrast CT appearance of the brain since 0145 hours today. Electronically Signed   By: Odessa Fleming M.D.   On: 11/21/2020 06:45   CT Cervical Spine Wo Contrast  Result Date: 11/21/2020 CLINICAL DATA:  Neck trauma, seizure, found unresponsive EXAM: CT CERVICAL SPINE WITHOUT CONTRAST TECHNIQUE: Multidetector CT imaging of the cervical spine was performed without intravenous contrast. Multiplanar CT image reconstructions were also generated. COMPARISON:  None. FINDINGS: Alignment: Alignment is anatomic. Skull base and vertebrae: No acute fracture. No primary bone lesion or focal pathologic process. Soft tissues and spinal canal: No prevertebral fluid or swelling. No visible canal hematoma. Disc levels: Mild spondylosis at C4-5 and C5-6. Minimal right predominant neural foraminal narrowing at C4-5 with symmetrical neural foraminal encroachment at C5-6. Upper chest: Airway is patent.  Lung apices are clear. Other: Reconstructed images demonstrate no additional findings. IMPRESSION: 1. No acute cervical spine fracture. 2. Mild cervical spondylosis as above. Electronically Signed   By: Sharlet Salina M.D.   On: 11/21/2020 02:10   MR BRAIN W WO CONTRAST  Result Date: 11/22/2020 CLINICAL DATA:  60 year old male is unresponsive, seizures. EXAM: MRI HEAD WITHOUT AND WITH CONTRAST TECHNIQUE: Multiplanar, multiecho pulse sequences of the brain and surrounding structures were obtained without and with intravenous contrast. CONTRAST:  6.75mL GADAVIST GADOBUTROL 1 MMOL/ML IV SOLN COMPARISON:  CT head and CTA yesterday. FINDINGS: Brain: Quads I symmetric intense diffusion restriction along the medial aspect of both thalami  (series 7, image 74 and series 8, image 24). Associated T2 and FLAIR hyperintensity, with microhemorrhage or petechial hemorrhage associated on the left side (series 16, image 30). No mass effect. T1 hypointensity. No enhancement. No other restricted diffusion. No midline shift, mass effect, evidence of mass lesion, ventriculomegaly, extra-axial collection. Cervicomedullary junction and pituitary are within normal limits. Outside of the acute findings there are multifocal areas of white matter T2 and FLAIR hyperintensity with cystic encephalomalacia most resembling white matter lacunar infarcts (series 13, image 18 and image 19 bilateral corona radiata). But T2 heterogeneity in the right basal ganglia could be perivascular spaces. Questionable small chronic infarct in the right cerebellum versus asymmetric prominence of the folia. No cortical encephalomalacia or other cerebral blood products identified. Thin slice coronal images demonstrate symmetric hippocampal formations and mesial temporal lobes, along with the abnormal signal in the medial thalami. No abnormal enhancement identified. No dural thickening. Vascular: Major intracranial vascular flow voids are stable to the CTA findings yesterday. Dominant left vertebral artery which supplies the basilar. Absent flow void distal right vertebral artery compatible with occlusion. The major dural venous sinuses are enhancing and appear to be patent. Skull and upper cervical spine: Partially visible degenerative cervical spinal stenosis at C4-C5. Visualized bone marrow signal is within normal limits. Sinuses/Orbits: Negative. Other: Mastoids are clear. Visible internal auditory structures appear normal. Visible scalp and face appear negative. IMPRESSION: 1. Positive for acute Artery of Percheron infarction, with abnormal paramedian thalami. Punctate hemorrhage associated on the left. No mass effect. 2. Evidence of underlying chronic lacunar infarcts in the bilateral  cerebral white matter. 3. Large vessel flow voids are stable to the CTA findings yesterday. 4. Partially visible degenerative cervical spinal stenosis at C4-C5. Electronically Signed   By: Odessa Fleming M.D.   On: 11/22/2020 05:09  DG Chest Portable 1 View  Result Date: 11/21/2020 CLINICAL DATA:  Initial evaluation for acute unresponsiveness, seizure. EXAM: PORTABLE CHEST 1 VIEW COMPARISON:  None. FINDINGS: Transverse heart size at the upper limits of normal. Mediastinal silhouette within normal limits. Lungs are hypoinflated. Secondary diffuse bronchovascular crowding. Asymmetric hazy right basilar opacity favored to reflect bronchovascular crowding and/or atelectatic disease. Possible infectious or aspiration pneumonitis could be considered in the correct clinical setting. No other focal airspace disease. No edema or effusion. No pneumothorax. No acute osseous finding. IMPRESSION: Shallow lung inflation with asymmetric hazy right basilar opacity. Atelectasis and/or bronchovascular crowding is favored, although possible infection or aspiration could be considered in the correct clinical setting. Electronically Signed   By: Rise Mu M.D.   On: 11/21/2020 02:06   EEG adult  Result Date: 11/21/2020 Charlsie Quest, MD     11/21/2020  9:34 AM Patient Name: Franco Duley MRN: [id number] Epilepsy Attending: Charlsie Quest Referring Physician/Provider: Dr Erick Blinks Date: 11/21/2020 Duration: 22.11 mins Patient history: 60 y.o. male with PMH significant for DM2, HTN who presents with unresponsiveness at home s/p episodes of ?posturing by EMS and given Versed 2.5mg  x 2 and loaded with Keppra for another episode here. EEG to evaluate for seizure Level of alertness: awake, asleep AEDs during EEG study: Ativan, LEV Technical aspects: This EEG study was done with scalp electrodes positioned according to the 10-20 International system of electrode placement. Electrical activity was acquired at a sampling  rate of 500Hz  and reviewed with a high frequency filter of 70Hz  and a low frequency filter of 1Hz . EEG data were recorded continuously and digitally stored. Description: No posterior dominant rhythm was seen. Sleep was characterized by vertex waves, sleep spindles (12 to 14 Hz), maximal frontocentral region.  There is an excessive amount of 15 to 18 Hz, 2-3 uV beta activity with irregular morphology distributed symmetrically and diffusely. Hyperventilation and photic stimulation were not performed.   ABNORMALITY - Excessive beta, generalized IMPRESSION: This study is within normal limits. No seizures or epileptiform discharges were seen throughout the recording. The excessive beta activity seen in the background is most likely due to the effect of benzodiazepine and is a benign EEG pattern. Priyanka   Overnight EEG with video  Result Date: 11/22/2020 , MD     11/22/2020  7:23 AM Patient Name: Lamoine Magallon MRN: Charlsie Quest Epilepsy Attending: 11/24/2020 Referring Physician/Provider: Dr Bess Harvest Date: 11/21/2020 Duration: 11/21/2020 2240 to 11/21/2020 2304  Patient history: 60 y.o.malewith PMH significant for DM2, HTN who presents with unresponsiveness at home s/p episodes of ?posturing by EMS and given Versed 2.5mg  x 2 and loaded with Keppra for another episode here. EEG to evaluate for seizure  Level of alertness: awake, asleep  AEDs during EEG study: Ativan, LEV  Technical aspects: This EEG study was done with scalp electrodes positioned according to the 10-20 International system of electrode placement. Electrical activity was acquired at a sampling rate of 500Hz  and reviewed with a high frequency filter of 70Hz  and a low frequency filter of 1Hz . EEG data were recorded continuously and digitally stored.  Description: No posterior dominant rhythm was seen. Sleep was characterized by  sleep spindles (12 to 14 Hz), maximal frontocentral region. Hyperventilation and photic  stimulation were not performed.  Patient pulled electrodes after 2300 and rest of the study was not interpretable.  IMPRESSION: This study is within normal limits. No seizures or epileptiform discharges were seen throughout the recording. Priyanka  Annabelle Harman     PHYSICAL EXAM  Temp:  [97.9 F (36.6 C)-98.8 F (37.1 C)] 97.9 F (36.6 C) (03/19 1648) Pulse Rate:  [70-85] 82 (03/19 1648) Resp:  [16-20] 20 (03/19 1648) BP: (84-120)/(60-85) 116/85 (03/19 1648) SpO2:  [94 %-100 %] 100 % (03/19 1648) Weight:  [63.8 kg] 63.8 kg (03/19 0331)  General - Well nourished, well developed, in no apparent distress.  Ophthalmologic - fundi not visualized due to noncooperation.  Cardiovascular - Regular rhythm and rate.  Neuro - drowsy sleepy but easily open eyes on voice, orientated to age, place, time and people. No aphasia, fluent language, following all simple commands. Able to name and repeat. No gaze palsy, tracking bilaterally, visual field full, PERRL, right mild ptosis chronic per wife. No facial droop. Tongue midline. Bilateral UEs 5/5, no drift. Bilaterally LEs 5/5, no drift. Sensation symmetrical bilaterally, b/l FTN intact, gait not tested.  .   ASSESSMENT/PLAN Mr. Perfecto Purdy is a 60 y.o. male with history of diabetes, hypertension, hyperlipidemia admitted for episode of unresponsiveness. No tPA given due to outside window.    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  Continue DAPT for 3 weeks and then aspirin alone.  Patient counseled to be compliant with his  antithrombotic medications  Ongoing aggressive stroke risk factor management  Therapy recommendations: Pending  Disposition: Pending  Diabetes  HgbA1c 7.6 goal < 7.0  Unontrolled  CBG monitoring  SSI  close PCP follow up for better DM control  Hypertension . Stable on the low end . Gradually normalized BP within 3-5 days.  Long term BP goal normotensive  Hyperlipidemia  Home meds: Crestor 40  LDL 69.7, goal < 70  Now on Crestor 40  Continue statin at discharge  Other Stroke Risk Factors  B12 = 211, put on B12 supplement  Other Active Problems    Hospital day # 1  Neurology will sign off. Please call with questions. Pt will follow up with stroke clinic NP at [facility] in about 4 weeks. Thanks for the consult.   Marvel Plan, MD PhD Stroke Neurology 11/22/2020 5:56 PM    To contact Stroke Continuity provider, please refer to [email or web address]. After hours, contact General Neurology

## 2020-11-22 NOTE — Progress Notes (Signed)
EEG unhook in process, result pending

## 2020-11-22 NOTE — Evaluation (Signed)
Clinical/Bedside Swallow Evaluation Patient Details  Name: Chad Mcbride MRN: [id number] Date of Birth: [birth year]  Today's Date: 11/22/2020 Time: SLP Start Time (ACUTE ONLY): 1535 SLP Stop Time (ACUTE ONLY): 1548 SLP Time Calculation (min) (ACUTE ONLY): 13 min  Past Medical History:  Past Medical History:  Diagnosis Date  . Diabetes mellitus without complication (HCC)   . Hypertension    Past Surgical History: History reviewed. No pertinent surgical history. HPI:  Chad Mcbride is a 60 y.o. male with PMH significant for DM2, HTN, HLD who presents with unresponsiveness. Patient with multiple episodes of seizure-like activity. 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 Punctate hemorrhage associated on the left. 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   Assessment / Plan / Recommendation Clinical Impression  Pt needed moderate verbal and tactile stimulation to wake and although drowsy, able to participate. Consumed close to 3 oz water via straw without s/s aspiration. Multiple trials of solid texture with functional manipulation, transit. No residue on weaker right oral cavity. Recommend regular texture, thin liquids, pills with thin and follow up with ST x 1. SLP Visit Diagnosis: Dysphagia, unspecified (R13.10)    Aspiration Risk  Mild aspiration risk    Diet Recommendation Regular;Thin liquid   Liquid Administration via: Cup;Straw Medication Administration: Whole meds with liquid Supervision: Staff to assist with self feeding Compensations: Slow rate;Small sips/bites Postural Changes: Seated upright at 90 degrees    Other  Recommendations Oral Care Recommendations: Oral care BID   Follow up Recommendations Inpatient Rehab      Frequency and Duration min 2x/week  2 weeks       Prognosis Prognosis for Safe Diet Advancement: Good      Swallow Study   General Date of  Onset: 11/21/20 HPI: Chad Mcbride is a 60 y.o. male with PMH significant for DM2, HTN, HLD who presents with unresponsiveness. Patient with multiple episodes of seizure-like activity. 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 Punctate hemorrhage associated on the left. 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 Type of Study: Bedside Swallow Evaluation Previous Swallow Assessment: none Diet Prior to this Study: NPO Temperature Spikes Noted: No Respiratory Status: Room air History of Recent Intubation: No Behavior/Cognition: Lethargic/Drowsy;Cooperative;Requires cueing Oral Cavity Assessment: Other (comment) (lingual candidias) Oral Care Completed by SLP: Yes Oral Cavity - Dentition: Adequate natural dentition Vision:  (?dipolopia) Self-Feeding Abilities: Needs assist Patient Positioning: Upright in bed Baseline Vocal Quality: Low vocal intensity Volitional Cough: Strong Volitional Swallow: Able to elicit    Oral/Motor/Sensory Function Overall Oral Motor/Sensory Function: Mild impairment Facial ROM: Reduced right;Suspected CN VII (facial) dysfunction Facial Symmetry: Abnormal symmetry right;Suspected CN VII (facial) dysfunction   Ice Chips Ice chips: Not tested   Thin Liquid Thin Liquid: Within functional limits Presentation: Cup;Straw    Nectar Thick Nectar Thick Liquid: Not tested   Honey Thick Honey Thick Liquid: Not tested   Puree Puree: Not tested   Solid     Solid: Within functional limits      Royce Macadamia 11/22/2020,5:37 PM  Breck Coons Lonell Face.Ed Nurse, children's [phone] Office [phone]

## 2020-11-22 NOTE — Progress Notes (Addendum)
Neurology Note  MRI brain wwo overnight showed  1. Acute Artery of Percheron infarction, with abnormal paramedian thalami. Punctate hemorrhage associated on the left. No mass effect. 2. 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 3. Large vessel flow voids are stable to the CTA findings yesterday. 4. Partially visible degenerative cervical spinal stenosis at C4-C5.  Artery of Percheron is a anatomic variant of thalamic blood supply which when occluded results in bilateral paramedian thalamic strokes which accounts for his lethargy and altered mental status.  cEEG overnight showed no PDR, + sleep architecture, no IID or sz, excess beta. EEG was unhooked.  Recommend stroke w/u: - CTA H&N completed - TTE w bubble - LDL, A1c - NPO until encephalopathy improves and passes swallow study - ASA 150mg  PR daily until safe for po. It does not appear patient was on an antiplatelet prior to admission. Not adding plavix at this time 2/2 not taking po and 2/2 punctate hemorrhage in L thalamic infarct. - B1 level pending; for now initiate wernicke dosing B1 (case reports (see 2021) suggest possible benefit in bilateral thalamic infarction, risk of side effects minimal) - Strict avoidance of hypotension - Monitor on tele - Amb cardiac monitoring after discharge - Stroke team will take over for neurohospitalist consulting    2022, Osker Mason Georgianne Fick. Good Response to Thiamine in Bilateral Thalamic Infarction Simulating a Wernicke Syndrome: Does it Has a Role in Acute Stroke?Meryl Crutch Arch Neurol & Neurosci. 10(4): 2021.    2022, MD [facility] [phone]  If 7pm- 7am, please page neurology on call as listed in [facility].

## 2020-11-23 LAB — BASIC METABOLIC PANEL
Anion gap: 7 (ref 5–15)
BUN: 15 mg/dL (ref 6–20)
CO2: 23 mmol/L (ref 22–32)
Calcium: 8.6 mg/dL — ABNORMAL LOW (ref 8.9–10.3)
Chloride: 106 mmol/L (ref 98–111)
Creatinine, Ser: 0.9 mg/dL (ref 0.61–1.24)
GFR, Estimated: >60 mL/min (ref >60)
Glucose, Bld: 129 mg/dL — ABNORMAL HIGH (ref 70–99)
Potassium: 3.8 mmol/L (ref 3.5–5.1)
Sodium: 136 mmol/L (ref 135–145)

## 2020-11-23 LAB — CBC
HCT: 43.1 % (ref 39.0–52.0)
Hemoglobin: 14.5 g/dL (ref 13.0–17.0)
MCH: 29.9 pg (ref 26.0–34.0)
MCHC: 33.6 g/dL (ref 30.0–36.0)
MCV: 88.9 fL (ref 80.0–100.0)
Platelets: 293 10*3/uL (ref 150–400)
RBC: 4.85 MIL/uL (ref 4.22–5.81)
RDW: 12.3 % (ref 11.5–15.5)
WBC: 5.8 10*3/uL (ref 4.0–10.5)
nRBC: 0 % (ref 0.0–0.2)

## 2020-11-23 LAB — GLUCOSE, CAPILLARY: Glucose-Capillary: 129 mg/dL — ABNORMAL HIGH (ref 70–99)

## 2020-11-23 MED ORDER — CLOPIDOGREL BISULFATE 75 MG PO TABS: 75.0000 mg | ORAL_TABLET | Freq: Every day | ORAL | 0 refills | Status: DC

## 2020-11-23 MED ORDER — ASPIRIN 81 MG PO CHEW: 81.0000 mg | CHEWABLE_TABLET | Freq: Every day | ORAL | 0 refills | Status: AC

## 2020-11-23 MED ORDER — THIAMINE HCL 100 MG PO TABS: 100.0000 mg | ORAL_TABLET | Freq: Every day | ORAL | 0 refills | Status: DC

## 2020-11-23 MED ORDER — INSULIN ASPART 100 UNIT/ML ~~LOC~~ SOLN
0.0000 [IU] | Freq: Three times a day (TID) | SUBCUTANEOUS | Status: DC
  Administered 2020-11-23: 3 [IU] via SUBCUTANEOUS

## 2020-11-23 MED ORDER — CYANOCOBALAMIN 1000 MCG PO TABS: 1000.0000 ug | ORAL_TABLET | Freq: Every day | ORAL | 0 refills | Status: AC

## 2020-11-23 NOTE — Discharge Summary (Signed)
Name: Chad Mcbride MRN: [id number] DOB: 1961/02/13 60 y.o. PCP: No primary care provider on file.  Date of Admission: 11/21/2020 12:41 AM Date of Discharge: 11/23/2020 Attending Physician: Earl Lagos, MD  Discharge Diagnosis: 1. Active Problems:   Recurrent episodes of unresponsiveness   Cerebral thrombosis with cerebral infarction  Discharge Medications: Allergies as of 11/23/2020   No Known Allergies     Medication List    STOP taking these medications   metoprolol succinate 50 MG 24 hr tablet Commonly known as: TOPROL-XL   omeprazole 40 MG capsule Commonly known as: PRILOSEC     TAKE these medications   aspirin 81 MG chewable tablet Chew 1 tablet (81 mg total) by mouth daily. Start taking on: November 24, 2020   Basaglar KwikPen 100 UNIT/ML Inject 20 Units into the skin daily.   clopidogrel 75 MG tablet Commonly known as: PLAVIX Take 1 tablet (75 mg total) by mouth daily. Start taking on: November 24, 2020   cyanocobalamin 1000 MCG tablet Take 1 tablet (1,000 mcg total) by mouth daily. Start taking on: November 24, 2020   Janumet 50-1000 MG tablet Generic drug: sitaGLIPtin-metformin Take 1 tablet by mouth 2 (two) times daily.   Jardiance 25 MG Tabs tablet Generic drug: empagliflozin Take 25 mg by mouth daily.   rosuvastatin 40 MG tablet Commonly known as: CRESTOR Take 40 mg by mouth daily.   thiamine 100 MG tablet Take 1 tablet (100 mg total) by mouth daily.      Disposition and follow-up:   Chad Mcbride was discharged from [facility] in stable condition.  At the hospital follow up visit please address:  1.  Artery of Percheron stroke - Patient was admitted for altered mental status found to have stroke in artery of percheron. On day of discharge, patient noted to have no deficits on physical examination aside from some continued somnolence and impaired recent memory. Patient evaluated by PT/OT/SLP with no ongoing therapy needs.  Patient discharged with DAPT for 3 weeks followed by aspirin alone, crestor, thiamine, and vitamin B12 supplementation. -Follow-up with PCP in 1week -Follow-up with neurology in 4 weeks -Patient will need tighter control of diabetes as his HbA1c was 7.6 (discharged on prior regimen)  2.  Labs / imaging needed at time of follow-up: None  3.  Pending labs/ test needing follow-up: Vitamin B1  Follow-up Appointments:  Follow-up Information    [facility]. Schedule an appointment as soon as possible for a visit in 4 week(s).   Specialty: Neurology Contact information: [address] [place] [address] [phone]       [name], MD. Schedule an appointment as soon as possible for a visit in 1 week(s).   Specialty: Family Medicine Contact information: [address] [place] [address] [phone]              Hospital Course by problem list: 1. Artery of percheron infarction: Patient was admitted for unresponsiveness. On arrival, patient's presentation was concerning for seizure. Patient underwent continuous EEG which was unremarkable. Patient obtained CT Head, CT cervical spine, CXR, CTA Head, CTA Neck which did not reveal infarction. Follow-up MRI revealed infarction in the artery of percheron with bilateral thalamic infarction. Patient was evaluated for two additional days with improvement of his mentation and physical functioning. On day of discharge, patient did not have ongoing neurological deficits aside from continued periods of tiredness and impaired recent memory. Patient evaluated by PT/OT/SLP  with no further recommendations or ongoing care needs. Stroke team recommended three weeks of dual antiplatelet therapy followed by aspirin alone. Patient continued on rosuvastatin. Discontinued metoprolol. Neurology also recommended thiamine supplementation which we continued upon discharge. Patient encouraged  to follow-up with PCP in 1 week and neurology in 4 weeks.  Pertinent Labs, Studies, and Procedures:  CBC Latest Ref Rng & Units 11/23/2020 11/22/2020 11/21/2020  WBC 4.0 - 10.5 K/uL 5.8 7.3 8.9  Hemoglobin 13.0 - 17.0 g/dL 16.1 09.6 04.5  Hematocrit 39.0 - 52.0 % 43.1 44.9 41.6  Platelets 150 - 400 K/uL 293 259 275   CMP Latest Ref Rng & Units 11/23/2020 11/22/2020 11/21/2020  Glucose 70 - 99 mg/dL 409(W) 72 119(J)  BUN 6 - 20 mg/dL Creatinine 0.61 - 1.24 mg/dL 4.78 2.95 6.21  Sodium 135 - 145 mmol/L 136 140 139  Potassium 3.5 - 5.1 mmol/L 3.8 4.1 4.0  Chloride 98 - 111 mmol/L 106 110 105  CO2 22 - 32 mmol/L 23 20(L) 26  Calcium 8.9 - 10.3 mg/dL 3.0(Q) 6.5(H) 9.1  Total Protein 6.5 - 8.1 g/dL - - 5.9(L)  Total Bilirubin 0.3 - 1.2 mg/dL - - 0.8  Alkaline Phos 38 - 126 U/L - - 48  AST 15 - 41 U/L - - 26  ALT 0 - 44 U/L - - 29  CT ANGIO HEAD W OR WO CONTRAST  Result Date: 11/21/2020 CLINICAL DATA:  60 year old male is unresponsive. Seizures. Query basilar artery thrombosis. EXAM: CT ANGIOGRAPHY HEAD AND NECK TECHNIQUE: Multidetector CT imaging of the head and neck was performed using the standard protocol during bolus administration of intravenous contrast. Multiplanar CT image reconstructions and MIPs were obtained to evaluate the vascular anatomy. Carotid stenosis measurements (when applicable) are obtained utilizing NASCET criteria, using the distal internal carotid diameter as the denominator. CONTRAST:  75mL OMNIPAQUE IOHEXOL 350 MG/ML SOLN COMPARISON:  CTA head and cervical spine 0145 hours today. FINDINGS: CT HEAD Brain: Stable non contrast CT appearance of the brain. Patchy hypodensity scattered in the bilateral cerebral white matter and bilateral deep gray nuclei. Superimposed dystrophic calcifications in the basal ganglia. Brainstem appears stable. Partial volume artifacts suspected in the right cerebellum on series 5, image 7, prominent cerebellar sulcus suspected here on coronal  series 7, image 50. Calvarium and skull base: No acute osseous abnormality identified. Paranasal sinuses: Visualized paranasal sinuses and mastoids are stable and well pneumatized. Orbits: Visualized orbits and scalp soft tissues are within normal limits. CTA NECK Skeleton: No acute osseous abnormality identified. Upper chest: Minimally included, negative. Other neck: Retained secretions in the oropharynx. No other acute finding. Aortic arch: Aortic arch not included. Right carotid system: Right CCA origin partially visible and appears normal. Negative right CCA and right carotid bifurcation. Negative cervical right ICA. Left carotid system: Negative visible left CCA. Negative left carotid bifurcation. Minimal irregularity of the ventral left ICA contour distal to the bulb. But no convincing Fibromuscular Dysplasia (FMD), and otherwise negative. Vertebral arteries: Dominant right vertebral artery. Visible proximal right subclavian artery and origin of the non dominant right vertebral arteries are normal. The right vertebral is diminutive but remains patent to the skull base. Visible proximal left subclavian artery and left vertebral artery origin are normal. The left vertebral right holes the size of the ICAs and is widely patent throughout the neck to the skull base with no atherosclerosis identified. CTA HEAD Posterior circulation: Dominant left vertebral artery supplies the basilar which is patent to the  tip without stenosis. Normal SCA and PCA origins. Bilateral AICA origins are also patent. Normal left PICA origin. Non dominant right vertebral artery demonstrates proximal V4 segment calcified plaque on series 11, image 156 and appears functionally occluded distal to that. There is reconstitution of the right PICA. Mild irregularity of the left PCA P2 segment. Otherwise the bilateral PCA branches are within normal limits. Anterior circulation: Patent and mildly ectatic left ICA siphon with no atherosclerosis or  stenosis. There is a normal left posterior communicating artery. Moderate calcified plaque of the right ICA siphon in the distal cavernous and proximal supraclinoid segments but no stenosis. Patent carotid termini, MCA and ACA origins. Diminutive anterior communicating artery. Bilateral ACA branches are within normal limits. Left MCA M1 segment and bifurcation are widely patent. Right MCA M1 segment and bifurcation are widely patent. Bilateral MCA branches are within normal limits. Venous sinuses: Early contrast timing, but patent. Anatomic variants: Dominant left vertebral artery which supplies the basilar. Review of the MIP images confirms the above findings IMPRESSION: 1. No emergent large vessel occlusion. Patent basilar artery, supplied by dominant left vertebral artery. 2. But positive for functionally occluded diminutive right Vertebral Artery V4 segment. Right AICA is patent and there is some enhancement of the right PICA. No definite acute right cerebellar infarct on noncontrast head CT. 3. Otherwise very little atherosclerosis in the head and neck. Aortic arch not included. Some underlying arterial ectasia with no other stenosis identified. 4. Stable non contrast CT appearance of the brain since 0145 hours today. Electronically Signed   By: Odessa Fleming M.D.   On: 11/21/2020 06:45   CT Head Wo Contrast  Result Date: 11/21/2020 CLINICAL DATA:  Unresponsive, seizures EXAM: CT HEAD WITHOUT CONTRAST TECHNIQUE: Contiguous axial images were obtained from the base of the skull through the vertex without intravenous contrast. COMPARISON:  None. FINDINGS: Brain: Hypodensities are seen within the bilateral periventricular white matter, most pronounced within the right frontal and left parietal regions, compatible with age-indeterminate small vessel ischemic change. Focal hypodensity right basal ganglia consistent with dilated perivascular space or chronic lacunar infarct. No acute hemorrhage. Bilateral basal ganglia  calcifications are noted. Lateral ventricles and midline structures are otherwise unremarkable. No acute extra-axial fluid collections. No mass effect. Vascular: No hyperdense vessel or unexpected calcification. Skull: Normal. Negative for fracture or focal lesion. Sinuses/Orbits: No acute finding. Other: None. IMPRESSION: 1. Hypodensities throughout the periventricular white matter, likely representing chronic small vessel ischemic change. 2. No acute infarct or hemorrhage. Electronically Signed   By: Sharlet Salina M.D.   On: 11/21/2020 02:07   CT ANGIO NECK W OR WO CONTRAST  Result Date: 11/21/2020 CLINICAL DATA:  60 year old male is unresponsive. Seizures. Query basilar artery thrombosis. EXAM: CT ANGIOGRAPHY HEAD AND NECK TECHNIQUE: Multidetector CT imaging of the head and neck was performed using the standard protocol during bolus administration of intravenous contrast. Multiplanar CT image reconstructions and MIPs were obtained to evaluate the vascular anatomy. Carotid stenosis measurements (when applicable) are obtained utilizing NASCET criteria, using the distal internal carotid diameter as the denominator. CONTRAST:  75mL OMNIPAQUE IOHEXOL 350 MG/ML SOLN COMPARISON:  CTA head and cervical spine 0145 hours today. FINDINGS: CT HEAD Brain: Stable non contrast CT appearance of the brain. Patchy hypodensity scattered in the bilateral cerebral white matter and bilateral deep gray nuclei. Superimposed dystrophic calcifications in the basal ganglia. Brainstem appears stable. Partial volume artifacts suspected in the right cerebellum on series 5, image 7, prominent cerebellar sulcus suspected here on coronal  series 7, image 50. Calvarium and skull base: No acute osseous abnormality identified. Paranasal sinuses: Visualized paranasal sinuses and mastoids are stable and well pneumatized. Orbits: Visualized orbits and scalp soft tissues are within normal limits. CTA NECK Skeleton: No acute osseous abnormality  identified. Upper chest: Minimally included, negative. Other neck: Retained secretions in the oropharynx. No other acute finding. Aortic arch: Aortic arch not included. Right carotid system: Right CCA origin partially visible and appears normal. Negative right CCA and right carotid bifurcation. Negative cervical right ICA. Left carotid system: Negative visible left CCA. Negative left carotid bifurcation. Minimal irregularity of the ventral left ICA contour distal to the bulb. But no convincing Fibromuscular Dysplasia (FMD), and otherwise negative. Vertebral arteries: Dominant right vertebral artery. Visible proximal right subclavian artery and origin of the non dominant right vertebral arteries are normal. The right vertebral is diminutive but remains patent to the skull base. Visible proximal left subclavian artery and left vertebral artery origin are normal. The left vertebral right holes the size of the ICAs and is widely patent throughout the neck to the skull base with no atherosclerosis identified. CTA HEAD Posterior circulation: Dominant left vertebral artery supplies the basilar which is patent to the tip without stenosis. Normal SCA and PCA origins. Bilateral AICA origins are also patent. Normal left PICA origin. Non dominant right vertebral artery demonstrates proximal V4 segment calcified plaque on series 11, image 156 and appears functionally occluded distal to that. There is reconstitution of the right PICA. Mild irregularity of the left PCA P2 segment. Otherwise the bilateral PCA branches are within normal limits. Anterior circulation: Patent and mildly ectatic left ICA siphon with no atherosclerosis or stenosis. There is a normal left posterior communicating artery. Moderate calcified plaque of the right ICA siphon in the distal cavernous and proximal supraclinoid segments but no stenosis. Patent carotid termini, MCA and ACA origins. Diminutive anterior communicating artery. Bilateral ACA branches are  within normal limits. Left MCA M1 segment and bifurcation are widely patent. Right MCA M1 segment and bifurcation are widely patent. Bilateral MCA branches are within normal limits. Venous sinuses: Early contrast timing, but patent. Anatomic variants: Dominant left vertebral artery which supplies the basilar. Review of the MIP images confirms the above findings IMPRESSION: 1. No emergent large vessel occlusion. Patent basilar artery, supplied by dominant left vertebral artery. 2. But positive for functionally occluded diminutive right Vertebral Artery V4 segment. Right AICA is patent and there is some enhancement of the right PICA. No definite acute right cerebellar infarct on noncontrast head CT. 3. Otherwise very little atherosclerosis in the head and neck. Aortic arch not included. Some underlying arterial ectasia with no other stenosis identified. 4. Stable non contrast CT appearance of the brain since 0145 hours today. Electronically Signed   By: Odessa Fleming M.D.   On: 11/21/2020 06:45   CT Cervical Spine Wo Contrast  Result Date: 11/21/2020 CLINICAL DATA:  Neck trauma, seizure, found unresponsive EXAM: CT CERVICAL SPINE WITHOUT CONTRAST TECHNIQUE: Multidetector CT imaging of the cervical spine was performed without intravenous contrast. Multiplanar CT image reconstructions were also generated. COMPARISON:  None. FINDINGS: Alignment: Alignment is anatomic. Skull base and vertebrae: No acute fracture. No primary bone lesion or focal pathologic process. Soft tissues and spinal canal: No prevertebral fluid or swelling. No visible canal hematoma. Disc levels: Mild spondylosis at C4-5 and C5-6. Minimal right predominant neural foraminal narrowing at C4-5 with symmetrical neural foraminal encroachment at C5-6. Upper chest: Airway is patent.  Lung apices are clear.  Other: Reconstructed images demonstrate no additional findings. IMPRESSION: 1. No acute cervical spine fracture. 2. Mild cervical spondylosis as above.  Electronically Signed   By: Sharlet SalinaMichael  Brown M.D.   On: 11/21/2020 02:10   MR BRAIN W WO CONTRAST  Result Date: 11/22/2020 CLINICAL DATA:  60 year old male is unresponsive, seizures. EXAM: MRI HEAD WITHOUT AND WITH CONTRAST TECHNIQUE: Multiplanar, multiecho pulse sequences of the brain and surrounding structures were obtained without and with intravenous contrast. CONTRAST:  6.395mL GADAVIST GADOBUTROL 1 MMOL/ML IV SOLN COMPARISON:  CT head and CTA yesterday. FINDINGS: Brain: Quads I symmetric intense diffusion restriction along the medial aspect of both thalami (series 7, image 74 and series 8, image 24). Associated T2 and FLAIR hyperintensity, with microhemorrhage or petechial hemorrhage associated on the left side (series 16, image 30). No mass effect. T1 hypointensity. No enhancement. No other restricted diffusion. No midline shift, mass effect, evidence of mass lesion, ventriculomegaly, extra-axial collection. Cervicomedullary junction and pituitary are within normal limits. Outside of the acute findings there are multifocal areas of white matter T2 and FLAIR hyperintensity with cystic encephalomalacia most resembling white matter lacunar infarcts (series 13, image 18 and image 19 bilateral corona radiata). But T2 heterogeneity in the right basal ganglia could be perivascular spaces. Questionable small chronic infarct in the right cerebellum versus asymmetric prominence of the folia. No cortical encephalomalacia or other cerebral blood products identified. Thin slice coronal images demonstrate symmetric hippocampal formations and mesial temporal lobes, along with the abnormal signal in the medial thalami. No abnormal enhancement identified. No dural thickening. Vascular: Major intracranial vascular flow voids are stable to the CTA findings yesterday. Dominant left vertebral artery which supplies the basilar. Absent flow void distal right vertebral artery compatible with occlusion. The major dural venous sinuses  are enhancing and appear to be patent. Skull and upper cervical spine: Partially visible degenerative cervical spinal stenosis at C4-C5. Visualized bone marrow signal is within normal limits. Sinuses/Orbits: Negative. Other: Mastoids are clear. Visible internal auditory structures appear normal. Visible scalp and face appear negative. IMPRESSION: 1. Positive for acute Artery of Percheron infarction, with abnormal paramedian thalami. Punctate hemorrhage associated on the left. No mass effect. 2. 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 3. Large vessel flow voids are stable to the CTA findings yesterday. 4. Partially visible degenerative cervical spinal stenosis at C4-C5. Electronically Signed   By: Odessa FlemingH  Hall M.D.   On: 11/22/2020 05:09   DG Chest Portable 1 View  Result Date: 11/21/2020 CLINICAL DATA:  Initial evaluation for acute unresponsiveness, seizure. EXAM: PORTABLE CHEST 1 VIEW COMPARISON:  None. FINDINGS: Transverse heart size at the upper limits of normal. Mediastinal silhouette within normal limits. Lungs are hypoinflated. Secondary diffuse bronchovascular crowding. Asymmetric hazy right basilar opacity favored to reflect bronchovascular crowding and/or atelectatic disease. Possible infectious or aspiration pneumonitis could be considered in the correct clinical setting. No other focal airspace disease. No edema or effusion. No pneumothorax. No acute osseous finding. IMPRESSION: Shallow lung inflation with asymmetric hazy right basilar opacity. Atelectasis and/or bronchovascular crowding is favored, although possible infection or aspiration could be considered in the correct clinical setting. Electronically Signed   By: Rise MuBenjamin  McClintock M.D.   On: 11/21/2020 02:06   EEG adult  Result Date: 11/21/2020 Charlsie QuestYadav, Priyanka O, MD     11/21/2020  9:34 AM Patient Name: Bess Harvestom Callan MRN: [id number] Epilepsy Attending: Charlsie QuestPriyanka O Yadav Referring Physician/Provider:  Dr Erick BlinksSalman Khaliqdina Date: 11/21/2020 Duration: 22.11 mins Patient history: 60 y.o.  male with PMH significant for DM2, HTN who presents with unresponsiveness at home s/p episodes of ?posturing by EMS and given Versed 2.5mg  x 2 and loaded with Keppra for another episode here. EEG to evaluate for seizure Level of alertness: awake, asleep AEDs during EEG study: Ativan, LEV Technical aspects: This EEG study was done with scalp electrodes positioned according to the 10-20 International system of electrode placement. Electrical activity was acquired at a sampling rate of 500Hz  and reviewed with a high frequency filter of 70Hz  and a low frequency filter of 1Hz . EEG data were recorded continuously and digitally stored. Description: No posterior dominant rhythm was seen. Sleep was characterized by vertex waves, sleep spindles (12 to 14 Hz), maximal frontocentral region.  There is an excessive amount of 15 to 18 Hz, 2-3 uV beta activity with irregular morphology distributed symmetrically and diffusely. Hyperventilation and photic stimulation were not performed.   ABNORMALITY - Excessive beta, generalized IMPRESSION: This study is within normal limits. No seizures or epileptiform discharges were seen throughout the recording. The excessive beta activity seen in the background is most likely due to the effect of benzodiazepine and is a benign EEG pattern. Priyanka   Overnight EEG with video  Result Date: 11/22/2020 , MD     11/22/2020  7:23 AM Patient Name: Sanders Manninen MRN: Charlsie Quest Epilepsy Attending: 11/24/2020 Referring Physician/Provider: Dr Bess Harvest Date: 11/21/2020 Duration: 11/21/2020 2240 to 11/21/2020 2304  Patient history: 60 y.o.malewith PMH significant for DM2, HTN who presents with unresponsiveness at home s/p episodes of ?posturing by EMS and given Versed 2.5mg  x 2 and loaded with Keppra for another episode here. EEG to evaluate for seizure  Level of alertness: awake,  asleep  AEDs during EEG study: Ativan, LEV  Technical aspects: This EEG study was done with scalp electrodes positioned according to the 10-20 International system of electrode placement. Electrical activity was acquired at a sampling rate of 500Hz  and reviewed with a high frequency filter of 70Hz  and a low frequency filter of 1Hz . EEG data were recorded continuously and digitally stored.  Description: No posterior dominant rhythm was seen. Sleep was characterized by  sleep spindles (12 to 14 Hz), maximal frontocentral region. Hyperventilation and photic stimulation were not performed.  Patient pulled electrodes after 2300 and rest of the study was not interpretable.  IMPRESSION: This study is within normal limits. No seizures or epileptiform discharges were seen throughout the recording. 11/23/2020   ECHOCARDIOGRAM COMPLETE BUBBLE STUDY  Result Date: 11/22/2020    ECHOCARDIOGRAM REPORT   Patient Name:   Mikie Bateson Date of Exam: 11/22/2020 Medical Rec #:   Height: Accession #:    Weight:       140.7 lb Date of Birth:  23-Dec-1960  BSA:          1.597 m Patient Age:    59 years   BP:           116/85 mmHg Patient Gender: M          HR:           80 bpm. Exam Location:  Inpatient Procedure: 2D Echo, Cardiac Doppler, Color Doppler and Saline Contrast Bubble            Study Indications:    Stroke  History:        Patient has no prior history of Echocardiogram examinations.  Sonographer:    Charlsie Quest RDCS Referring Phys: [phone] 11/24/2020  STACK IMPRESSIONS  1. Negative bubble study.  2. False tendon is present.. Left ventricular ejection fraction, by estimation, is 65 to 70%. The left ventricle has normal function. The left ventricle has no regional wall motion abnormalities. Left ventricular diastolic parameters are consistent with  Grade I diastolic dysfunction (impaired relaxation).  3. Right ventricular systolic function is normal. The right ventricular size is normal.  4. The  mitral valve is normal in structure. Trivial mitral valve regurgitation. No evidence of mitral stenosis.  5. The aortic valve is normal in structure. Aortic valve regurgitation is not visualized. No aortic stenosis is present.  6. The inferior vena cava is normal in size with greater than 50% respiratory variability, suggesting right atrial pressure of 3 mmHg.  7. Agitated saline contrast bubble study was negative, with no evidence of any interatrial shunt. Conclusion(s)/Recommendation(s): No intracardiac source of embolism detected on this transthoracic study. A transesophageal echocardiogram is recommended to exclude cardiac source of embolism if clinically indicated. FINDINGS  Left Ventricle: False tendon is present. Left ventricular ejection fraction, by estimation, is 65 to 70%. The left ventricle has normal function. The left ventricle has no regional wall motion abnormalities. The left ventricular internal cavity size was  normal in size. There is no left ventricular hypertrophy. Left ventricular diastolic parameters are consistent with Grade I diastolic dysfunction (impaired relaxation). Normal left ventricular filling pressure. Right Ventricle: The right ventricular size is normal. No increase in right ventricular wall thickness. Right ventricular systolic function is normal. Left Atrium: Left atrial size was normal in size. Right Atrium: Right atrial size was normal in size. Pericardium: There is no evidence of pericardial effusion. Mitral Valve: The mitral valve is normal in structure. Trivial mitral valve regurgitation. No evidence of mitral valve stenosis. Tricuspid Valve: The tricuspid valve is normal in structure. Tricuspid valve regurgitation is trivial. No evidence of tricuspid stenosis. Aortic Valve: The aortic valve is normal in structure. Aortic valve regurgitation is not visualized. No aortic stenosis is present. Aortic valve mean gradient measures 6.0 mmHg. Aortic valve peak gradient measures  9.9 mmHg. Aortic valve area, by VTI measures 2.29 cm. Pulmonic Valve: The pulmonic valve was normal in structure. Pulmonic valve regurgitation is not visualized. No evidence of pulmonic stenosis. Aorta: The aortic root is normal in size and structure. Venous: The inferior vena cava is normal in size with greater than 50% respiratory variability, suggesting right atrial pressure of 3 mmHg. IAS/Shunts: No atrial level shunt detected by color flow Doppler. Agitated saline contrast was given intravenously to evaluate for intracardiac shunting. Agitated saline contrast bubble study was negative, with no evidence of any interatrial shunt.  LEFT VENTRICLE PLAX 2D LVIDd:         4.20 cm  Diastology LVIDs:         2.80 cm  LV e' medial:    6.09 cm/s LV PW:         0.80 cm  LV E/e' medial:  11.2 LV IVS:        0.70 cm  LV e' lateral:   10.20 cm/s LVOT diam:     2.20 cm  LV E/e' lateral: 6.7 LV SV:         71 LV SV Index:   45 LVOT Area:     3.80 cm  RIGHT VENTRICLE RV Basal diam:  3.30 cm LEFT ATRIUM             Index       RIGHT ATRIUM  Index LA diam:        3.30 cm 2.07 cm/m  RA Area:     14.90 cm LA Vol (A2C):   44.8 ml 28.05 ml/m RA Volume:   34.70 ml  21.73 ml/m LA Vol (A4C):   41.4 ml 25.93 ml/m LA Biplane Vol: 43.1 ml 26.99 ml/m  AORTIC VALVE AV Area (Vmax):    2.61 cm AV Area (Vmean):   2.11 cm AV Area (VTI):     2.29 cm AV Vmax:           157.00 cm/s AV Vmean:          113.000 cm/s AV VTI:            0.310 m AV Peak Grad:      9.9 mmHg AV Mean Grad:      6.0 mmHg LVOT Vmax:         108.00 cm/s LVOT Vmean:        62.700 cm/s LVOT VTI:          0.187 m LVOT/AV VTI ratio: 0.60  AORTA Ao Root diam: 3.30 cm Ao Asc diam:  3.40 cm MITRAL VALVE MV Area (PHT): 3.91 cm    SHUNTS MV Decel Time: 194 msec    Systemic VTI:  0.19 m MV E velocity: 68.10 cm/s  Systemic Diam: 2.20 cm MV A velocity: 84.80 cm/s MV E/A ratio:  0.80 Tobias Alexander MD Electronically signed by Tobias Alexander MD Signature Date/Time:  11/22/2020/11:38:29 PM    Final    Discharge Instructions: Discharge Instructions    Ambulatory referral to Neurology   Complete by: As directed    Follow up with stroke clinic NP (Jessica Vanschaick or Darrol Angel, if both not available, consider Manson Allan, or [facility]) at [facility] in about 4 weeks. Thanks.   Call MD for:  difficulty breathing, headache or visual disturbances   Complete by: As directed    Call MD for:  extreme fatigue   Complete by: As directed    Call MD for:  hives   Complete by: As directed    Call MD for:  persistant dizziness or light-headedness   Complete by: As directed    Call MD for:  persistant nausea and vomiting   Complete by: As directed    Call MD for:  redness, tenderness, or signs of infection (pain, swelling, redness, odor or green/yellow discharge around incision site)   Complete by: As directed    Call MD for:  severe uncontrolled pain   Complete by: As directed    Call MD for:  temperature >100.4   Complete by: As directed    Diet - low sodium heart healthy   Complete by: As directed    Discharge instructions   Complete by: As directed    Mr. Gibbard,  It was a pleasure having the opportunity to meet you during your recent admission.  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 Our neurology colleagues have recommended that you take the following medications: -Aspirin  daily -Clopidogrel (Plavix)  daily for 21 days -Rosuvastatin  daily -Thiamine  daily -Vitamin B12 daily -Restart your home diabetes regimen  We ask that you follow-up with your PCP within 1 week and with neurology in 4 weeks.  Sincerely, Dr. Jasmine December, MD   Increase activity slowly   Complete by: As directed      Signed: Roylene Reason, MD 11/23/2020, 3:49 PM   Pager: [phone]

## 2020-11-23 NOTE — Progress Notes (Signed)
Pt's wife taking pt home by car. Pt and wife given DC packet and everything was discussed with them both.

## 2020-11-23 NOTE — Discharge Instructions (Signed)
Cognitive Rehabilitation After a Stroke After a stroke, you may have various problems with thinking (cognitive disability). The types of problems you have will depend on how severe the stroke was and where it was located in the brain. Problems may include:  Problems with short-term memory.  Trouble paying attention.  Trouble communicating or understanding language (aphasia).  A drop in mental ability that may interfere with daily life (dementia).  Trouble with problem-solving and information processing.  Problems with reading, writing, or math.  Problems with your ability to plan and to perform activities in sequence (executive function). These problems can feel overwhelming. However, with rehabilitation and time to heal, many people have improvement in their symptoms. What causes cognitive disability? A stroke happens when blood cannot flow to certain areas of the brain. When this happens, brain cells die in the affected areas because they cannot get oxygen and nutrients from the blood. Cognitive disability is caused by the death of cells in the areas of the brain that control thinking. What is cognitive rehabilitation? Cognitive rehabilitation is a program to help you improve your thinking skills after a stroke. Rehabilitation cannot completely reverse the effects of a stroke, but it can help you with memory, problem-solving, and communication skills. Therapy focuses on:  Improving brain function. This may involve activities such as learning to break down tasks into simple steps.  Helping you learn ways to cope with thinking problems. For example, you might learn memory tricks or do activities that stimulate memory, such as naming objects or describing pictures. Cognitive rehabilitation may include:  Speech-language therapy to help you understand and use language to communicate.  Occupational therapy to help you perform daily activities.  Music therapy to help relieve stress,  anxiety, and depression. This may involve listening to music, singing, or playing instruments.  Physical therapy to help improve your ability to move and perform actions that involve the muscles (motor functions).   When will therapy start and where will I have therapy? Your health care provider will decide when it is best for you to start therapy. In some cases, people start rehabilitation as soon as their health is stable, which may be 24-48 hours after the stroke. Rehabilitation can take place in a few different places, based on your needs. It may take place in:  The hospital or an in-patient rehabilitation hospital.  An outpatient rehabilitation facility.  A long-term care facility.  A community rehabilitation clinic.  Your home. What are some tools to help after a stroke? There are a number of tools and apps that you can use on your smartphone, personal computer, or tablet to help improve brain function. Some of these apps include:  Calendar reminders or alarm apps to help with memory.  Note-taking or sketch pad apps to help with memory or communication.  Text-to-speech apps that allow you to listen to what you are reading, which helps your ability to understanding text.  Picture dictionary or picture message apps to help with communication.  E-readers. These can highlight text as it is read aloud, which helps with listening and reading skills. How can my friends or family help during my rehabilitation? During your recovery, it is important that your friends and family members help you work toward more independence. Your caregivers should speak with your health care providers to learn how they can best help you during recovery. This may include working on speech-language or memory exercises at home, or helping with daily tasks and errands. If you have cognitive disability,  you may be at risk for injury or accidents at home, such as forgetting to turn off the stove. Friends and  family members can help ensure home safety by taking steps such as getting appliances with automatic shut-off features or storing dangerous objects in a secure place. What else should I know about cognitive rehabilitation after a stroke? Having trouble with memory and problem-solving can make you feel alone. You may also have mood changes, anxiety, or depression after a stroke. It is important to:  Stay connected with others through social groups, online support groups, or your community.  Talk to your friends, family, and caregivers about any emotional problems you are having.  Go to one-on-one or group therapy as suggested by your health care provider.  Stay physically active and exercise as often as suggested by your health care provider. Summary  After a stroke, some people have problems with thinking that affect attention, memory, language, communication, and problem-solving.  Cognitive rehabilitation is a program to help you regain brain function and learn skills to cope with thinking problems.  Rehabilitation cannot completely reverse the effects of a stroke, but it can help to improve quality of life.  Cognitive rehabilitation may include speech-language therapy, occupational therapy, music therapy, and physical therapy. This information is not intended to replace advice given to you by your health care provider. Make sure you discuss any questions you have with your health care provider. Document Revised: 12/13/2018 Document Reviewed: 11/26/2016 Elsevier Patient Education  2021 [employer]vier Inc.  

## 2020-11-23 NOTE — Evaluation (Signed)
Occupational Therapy Evaluation and Discharge Patient Details Name: Chad Mcbride MRN: [id number] DOB: [birth year] Today's Date: 11/23/2020    History of Present Illness Pt is a 60 year old man admitted with unresponsiveness and seizure like activity. 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 PMH: DM2, HTN, HLD.   Clinical Impression   Pt is functioning independently in ADL and short distance mobility within his room and bathroom. Brother in law in room and agrees pt is back to his baseline. Educated pt in s/s of stroke, risk factors and prevention. No further OT needs.     Follow Up Recommendations  No OT follow up    Equipment Recommendations  None recommended by OT    Recommendations for Other Services       Precautions / Restrictions Restrictions Weight Bearing Restrictions: No      Mobility Bed Mobility Overal bed mobility: Independent                  Transfers Overall transfer level: Independent                    Balance                                           ADL either performed or assessed with clinical judgement   ADL Overall ADL's : Independent                                             Vision Baseline Vision/History: Wears glasses (contacts) Wears Glasses: Reading only Patient Visual Report: No change from baseline       Perception     Praxis      Pertinent Vitals/Pain Pain Assessment: No/denies pain     Hand Dominance Right   Extremity/Trunk Assessment Upper Extremity Assessment Upper Extremity Assessment: Overall WFL for tasks assessed   Lower Extremity Assessment Lower Extremity Assessment: Defer to PT evaluation   Cervical / Trunk Assessment Cervical / Trunk Assessment: Normal   Communication Communication Communication: No difficulties   Cognition Arousal/Alertness: Awake/alert Behavior During Therapy: WFL for tasks  assessed/performed Overall Cognitive Status: Within Functional Limits for tasks assessed                                 General Comments: pt does not recall events leading up to admission   General Comments       Exercises     Shoulder Instructions      Home Living Family/patient expects to be discharged to:: Private residence Living Arrangements: Spouse/significant other;Children Available Help at Discharge: Family;Available PRN/intermittently Type of Home: House Home Access: Level entry     Home Layout: One level;1/2 bath on main level     Bathroom Shower/Tub: Chief Strategy Officer: Standard     Home Equipment: None          Prior Functioning/Environment Level of Independence: Independent                 OT Problem List:        OT Treatment/Interventions:      OT Goals(Current goals can be found in the care plan section)  Acute Rehab OT Goals Patient Stated Goal: to go home  OT Frequency:     Barriers to D/C:            Co-evaluation              AM-PAC OT "6 Clicks" Daily Activity     Outcome Measure Help from another person eating meals?: None Help from another person taking care of personal grooming?: None Help from another person toileting, which includes using toliet, bedpan, or urinal?: None Help from another person bathing (including washing, rinsing, drying)?: None Help from another person to put on and taking off regular upper body clothing?: None Help from another person to put on and taking off regular lower body clothing?: None 6 Click Score: 24   End of Session    Activity Tolerance: Patient tolerated treatment well Patient left: in bed;with call bell/phone within reach;with family/visitor present  OT Visit Diagnosis: Other symptoms and signs involving cognitive function                Time: 1749-4496 OT Time Calculation (min): 14 min Charges:  OT General Charges $OT Visit: 1 Visit  Chad Mcbride, OTR/L Acute Rehabilitation Services Pager: [phone] Office: [phone]  Chad Mcbride 11/23/2020, 1:56 PM

## 2020-11-23 NOTE — Progress Notes (Signed)
Subjective:  Overnight, no acute events.  This morning, patient evaluated at bedside with wife present. Patient reports that he feels well but has been having difficulty with recent memory. He denies any pain or weakness. His wife reports that he has seemed more confused and somnolent since his stroke. We discussed his medication regimen upon discharge and the importance of close follow-up with his PCP within seven days and his neurologist in four weeks. He understands and agrees with the plan to work with physical and occupational therapy today and if he does well, discharge home.  Objective:  Vital signs in last 24 hours: Vitals:   11/23/20 0357 11/23/20 0440 11/23/20 0752 11/23/20 1227  BP: 100/72  103/83   Pulse: 71 72 78 85  Resp: 17 17 20 19   Temp: 98.1 F (36.7 C)  98.1 F (36.7 C) 98.2 F (36.8 C)  TempSrc: Axillary  Oral Oral  SpO2: 97% 98% 98% 97%  Weight:  64 kg    On room air  Intake/Output Summary (Last 24 hours) at 11/23/2020 1431 Last data filed at 11/23/2020 0310 Gross per 24 hour  Intake 2691.01 ml  Output -  Net 2691.01 ml   Filed Weights   11/21/20 1537 11/22/20 0331 11/23/20 0440  Weight: 63.5 kg 63.8 kg 64 kg   Physical Exam Constitutional:      General: He is not in acute distress.    Appearance: Normal appearance. He is not ill-appearing.  Eyes:     Extraocular Movements: Extraocular movements intact.     Conjunctiva/sclera: Conjunctivae normal.  Cardiovascular:     Rate and Rhythm: Normal rate and regular rhythm.     Pulses: Normal pulses.     Heart sounds: Normal heart sounds.  Pulmonary:     Effort: Pulmonary effort is normal.     Breath sounds: Normal breath sounds.  Abdominal:     General: Abdomen is flat. Bowel sounds are normal.     Palpations: Abdomen is soft.     Tenderness: There is no abdominal tenderness.  Musculoskeletal:     Right lower leg: No edema.     Left lower leg: No edema.  Skin:    General: Skin is warm and dry.      Capillary Refill: Capillary refill takes less than 2 seconds.  Neurological:     General: No focal deficit present.     Mental Status: He is alert.     Cranial Nerves: No cranial nerve deficit.     Motor: No weakness.  Psychiatric:        Mood and Affect: Mood normal.        Behavior: Behavior normal.   Labs in last 24 hours: CBC Latest Ref Rng & Units 11/23/2020 11/22/2020 11/21/2020  WBC 4.0 - 10.5 K/uL 5.8 7.3 8.9  Hemoglobin 13.0 - 17.0 g/dL 11/23/2020 03.4 74.2  Hematocrit 39.0 - 52.0 % 43.1 44.9 41.6  Platelets 150 - 400 K/uL 293 259 275   BMP Latest Ref Rng & Units 11/23/2020 11/22/2020 11/21/2020  Glucose 70 - 99 mg/dL 11/23/2020) 72 638(V)  BUN 6 - 20 mg/dL 15 17 15   Creatinine 0.61 - 1.24 mg/dL 564(P 3.29  Sodium 135 - 145 mmol/L 136 140 139  Potassium 3.5 - 5.1 mmol/L 3.8 4.1 4.0  Chloride 98 - 111 mmol/L 106 110 105  CO2 22 - 32 mmol/L 23 20(L) 26  Calcium 8.9 - 10.3 mg/dL 5.18) 8.41) 9.1   Vitamin B1 - in process LDL -  69.7 Hemoglobin A1c - 7.6  Imaging in last 24 hours: Overnight EEG with video Result Date: 11/22/2020 IMPRESSION: This study is within normal limits. No seizures or epileptiform discharges were seen throughout the recording.  ECHOCARDIOGRAM COMPLETE BUBBLE STUDY Result Date: 11/22/2020 IMPRESSION: 1. Negative bubble study.  2. False tendon is present.. Left ventricular ejection fraction, by estimation, is 65 to 70%. The left ventricle has normal function. The left ventricle has no regional wall motion abnormalities. Left ventricular diastolic parameters are consistent with  Grade I diastolic dysfunction (impaired relaxation).  3. Right ventricular systolic function is normal. The right ventricular size is normal.  4. The mitral valve is normal in structure. Trivial mitral valve regurgitation. No evidence of mitral stenosis.  5. The aortic valve is normal in structure. Aortic valve regurgitation is not visualized. No aortic stenosis is present.  6. The inferior  vena cava is normal in size with greater than 50% respiratory variability, suggesting right atrial pressure of 3 mmHg.  7. Agitated saline contrast bubble study was negative, with no evidence of any interatrial shunt. Conclusion(s)/Recommendation(s): No intracardiac source of embolism detected on this transthoracic study. A transesophageal echocardiogram is recommended to exclude cardiac source of embolism if clinically indicated.   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 His wife reports that he appears more confused and struggling with short term memory. Additionally, he has periods of somnolence. These signs and symptoms are likely secondary to his deep brain infarction. Patient would benefit from close follow-up with PCP and neurology with consideration for referral to services to aid in his cognitive recovery. We will request PT and OT consultation today. If patient does well with his evaluation, he will be stable for discharge home. -Stroke team signed off  -Continue aspirin 81mg  daily  -Continue clopidogrel 75mg  daily (for 3 weeks)  -Continue thiamine  -Ongoing aggressive stroke risk factor management as below -PT/OT eval pending  #Type 2 diabetes mellitus, chronic Blood glucose readings labile in the setting of patient's limited intake following his stroke with gradual increasing of his oral intake. -CBG monitoring TID AC and QHS -Moderate SSI 0-15 units TID AC -Resume  prior diabetes regimen upon discharge -Follow-up with PCP within 1 week for titration  #HTN, chronic Blood pressure well controlled without antihypertensive medication. -Discontinued metoprolol  #HLD, chronic LDL 69.7, goal of less than 70 -Continue home rosuvastatin 40mg   #Vitamin B12 deficiency -Continue vitamin B12 supplementation daily  #Code status: Full code #Diet: Regular #VTE ppx: Enoxaparin 40mg  daily  Prior to Admission Living Arrangement: Home Anticipated Discharge Location: Home Barriers to Discharge: PT/OT eval Dispo: Anticipated discharge today  , MD 11/23/2020, 2:31 PM Pager: [phone] After 5pm on weekdays and 1pm on weekends: On Call pager [phone]

## 2020-11-23 NOTE — Plan of Care (Signed)

## 2020-11-23 NOTE — Progress Notes (Signed)
Hypoglycemic Event  CBG: 67 (time 357)  Treatment: Gave 8oz of Orange juice Symptoms: none  Follow-up CBG: Time:421 CBG Result:129  Possible Reasons for Event:   Comments/MD notified: Charge RN notified     Roanna Epley

## 2020-11-23 NOTE — Evaluation (Signed)
Physical Therapy Evaluation and Discharge Patient Details Name: Chad Mcbride MRN: [id number] DOB: [birth year] Today's Date: 11/23/2020   History of Present Illness  Pt is a 60 year old man admitted with unresponsiveness and seizure like activity. 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 PMH: DM2, HTN, HLD.  Clinical Impression   Patient evaluated by Physical Therapy with no further acute PT needs identified. Patient scored 23/24 on Dynamic Gait Index. All education has been completed and the patient has no further questions. PT is signing off. Thank you for this referral.     Follow Up Recommendations No PT follow up    Equipment Recommendations  None recommended by PT    Recommendations for Other Services       Precautions / Restrictions Precautions Precautions: None Restrictions Weight Bearing Restrictions: No      Mobility  Bed Mobility Overal bed mobility: Independent                  Transfers Overall transfer level: Independent                  Ambulation/Gait Ambulation/Gait assistance: Independent Gait Distance (Feet): 300 Feet Assistive device: None Gait Pattern/deviations: WFL(Within Functional Limits)   Gait velocity interpretation: >2.62 ft/sec, indicative of community ambulatory General Gait Details: see also DGI  Stairs Stairs: Yes Stairs assistance: Modified independent (Device/Increase time) Stair Management: One rail Right;Alternating pattern;Forwards Number of Stairs: 10    Wheelchair Mobility    Modified Rankin (Stroke Patients Only)       Balance                                 Standardized Balance Assessment Standardized Balance Assessment : Dynamic Gait Index   Dynamic Gait Index Level Surface: Normal Change in Gait Speed: Normal Gait with Horizontal Head Turns: Normal Gait with Vertical Head Turns: Normal Gait and Pivot Turn: Normal Step Over  Obstacle: Normal Step Around Obstacles: Normal Steps: Mild Impairment Total Score: 23       Pertinent Vitals/Pain Pain Assessment: No/denies pain    Home Living Family/patient expects to be discharged to:: Private residence Living Arrangements: Spouse/significant other;Children Available Help at Discharge: Family;Available PRN/intermittently Type of Home: House Home Access: Level entry     Home Layout: One level;1/2 bath on main level Home Equipment: None      Prior Function Level of [place]: Independent               Hand Dominance   Dominant Hand: Right    Extremity/Trunk Assessment   Upper Extremity Assessment Upper Extremity Assessment: Defer to OT evaluation    Lower Extremity Assessment Lower Extremity Assessment: Overall WFL for tasks assessed    Cervical / Trunk Assessment Cervical / Trunk Assessment: Normal  Communication   Communication: No difficulties  Cognition Arousal/Alertness: Awake/alert Behavior During Therapy: WFL for tasks assessed/performed Overall Cognitive Status: Within Functional Limits for tasks assessed                                 General Comments: pt does not recall events leading up to admission; brother-in-law states he is back to baseline (ox4)      General Comments      Exercises     Assessment/Plan    PT Assessment Patent does not need any further PT  services  PT Problem List         PT Treatment Interventions      PT Goals (Current goals can be found in the Care Plan section)  Acute Rehab PT Goals Patient Stated Goal: to go home PT Goal Formulation: All assessment and education complete, DC therapy    Frequency     Barriers to discharge        Co-evaluation               AM-PAC PT "6 Clicks" Mobility  Outcome Measure Help needed turning from your back to your side while in a flat bed without using bedrails?: None Help needed moving from lying on your back to sitting  on the side of a flat bed without using bedrails?: None Help needed moving to and from a bed to a chair (including a wheelchair)?: None Help needed standing up from a chair using your arms (e.g., wheelchair or bedside chair)?: None Help needed to walk in hospital room?: None Help needed climbing 3-5 steps with a railing? : None 6 Click Score: 24    End of Session   Activity Tolerance: Patient tolerated treatment well Patient left: in bed;with call bell/phone within reach;with family/visitor present Nurse Communication: Mobility status;Other (comment) (no f/u PT needs) PT Visit Diagnosis: Difficulty in walking, not elsewhere classified (R26.2)    Time: 1355-1405 PT Time Calculation (min) (ACUTE ONLY): 10 min   Charges:   PT Evaluation $PT Eval Low Complexity: 1 Low           Jerolyn Center, PT Pager [phone]   Zena Amos 11/23/2020, 2:16 PM

## 2020-11-24 ENCOUNTER — Encounter: Payer: Self-pay | Admitting: Cardiology

## 2020-11-24 LAB — METHYLMALONIC ACID, SERUM: Methylmalonic Acid, Quantitative: 113 nmol/L (ref 0–378)

## 2020-11-26 LAB — DRUG SCREEN 10 W/CONF, SERUM
Amphetamines, IA: NEGATIVE ng/mL
Barbiturates, IA: NEGATIVE ug/mL
Benzodiazepines, IA: NEGATIVE ng/mL
Cocaine & Metabolite, IA: NEGATIVE ng/mL
Methadone, IA: NEGATIVE ng/mL
Opiates, IA: NEGATIVE ng/mL
Oxycodones, IA: NEGATIVE ng/mL
Phencyclidine, IA: NEGATIVE ng/mL
Propoxyphene, IA: NEGATIVE ng/mL
THC(Marijuana) Metabolite, IA: NEGATIVE ng/mL

## 2020-12-04 LAB — VITAMIN B1: Vitamin B1 (Thiamine): 153.4 nmol/L (ref 66.5–200.0)

## 2020-12-23 ENCOUNTER — Encounter: Payer: Self-pay | Admitting: Adult Health

## 2020-12-23 ENCOUNTER — Ambulatory Visit (INDEPENDENT_AMBULATORY_CARE_PROVIDER_SITE_OTHER): Payer: 59 | Admitting: Adult Health

## 2020-12-23 VITALS — BP 122/81 | HR 72 | Ht 64.0 in | Wt 130.0 lb

## 2020-12-23 DIAGNOSIS — I639 Cerebral infarction, unspecified: Secondary | ICD-10-CM

## 2020-12-23 DIAGNOSIS — E538 Deficiency of other specified B group vitamins: Secondary | ICD-10-CM | POA: Diagnosis not present

## 2020-12-23 NOTE — Progress Notes (Signed)
[facility] [address] [place]. [place] [address] [phone]       HOSPITAL FOLLOW UP NOTE  [name] Date of Birth:  [birth year] Medical Record Number:  [id number]   Reason for Referral:  hospital stroke follow up    SUBJECTIVE:   CHIEF COMPLAINT:  Chief Complaint  Patient presents with  . Follow-up    RM 14 alone Pt is well, no complaints and no symptoms     HPI:   [name] is a 60 y.o. male with history of diabetes, hypertension, hyperlipidemia who presented on 11/21/2020 for episode of unresponsiveness with seizure activity witnessed by EMS in route to ED.  Personally reviewed hospitalization pertinent progress notes, lab work and imaging with summary provided.  Evaluated by Dr. Roda Shutters with stroke work-up revealing artery of percheron infarct (lateral hypothalamus and thalamic infarcts) likely secondary to small vessel disease.  EEG normal without evidence of seizure.  Recommended DAPT for 3 weeks and aspirin alone.  HTN stable.  LDL 69.7 on Crestor.  Uncontrolled DM with A1c 7.6.  B12 deficiency at 211 started on supplementation.  No prior stroke history.  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  Continue DAPT for 3 weeks and then aspirin alone.  Patient counseled to be compliant with his antithrombotic medications  Ongoing aggressive stroke risk factor management  Therapy recommendations:   None  Disposition:  Home  Today, 12/23/2020, [name] is being seen for hospital follow-up unaccompanied  He reports he has been doing well since he has returned home.  He initially was having memory concerns but this has since resolved.  He does report mild fatigue and decreased activity tolerance but this has been gradually improving.  He has been slowly returning back to prior activities.  Denies new recurrent stroke/TIA symptoms.  Completed 3 weeks DAPT and remains on aspirin alone without associated side effects Remains on Crestor without associated side effects Blood pressure today 122/81 Glucose levels have been stable per pt report  Remains on B12 supplement for B12 deficiency - has not had repeat levels  No concerns at this time     ROS:   14 system review of systems performed and negative with exception of fatigue  PMH:  Past Medical History:  Diagnosis Date  . Diabetes mellitus without complication (HCC)   . GERD (gastroesophageal reflux disease)   . Hyperlipidemia   . Hypertension     PSH: History reviewed. No pertinent surgical history.  Social History:  Social History   Socioeconomic History  . Marital status: Unknown    Spouse name: Not on file  . Number of children: 2  . Years of education: Not on file  . Highest education level: Not on file  Occupational History  . Occupation: Psychiatrist  Tobacco Use  . Smoking status: Current Every Day Smoker    Packs/day: 0.25  . Smokeless tobacco: Never Used  Substance and Sexual Activity  .  Alcohol use: Not Currently  . Drug use: Not on file  . Sexual activity: Yes  Other Topics Concern  . Not on file  Social History Narrative   ** Merged History Encounter **       Social Determinants of Health   Financial Resource Strain: Not on file  Food Insecurity: Not on file  Transportation Needs: Not on file  Physical Activity: Not on file  Stress: Not on file  Social Connections: Not on file   Intimate Partner Violence: Not on file    Family History:  Family History  Problem Relation Age of Onset  . Diabetes Mother   . Alcohol abuse Father     Medications:   Current Outpatient Medications on File Prior to Visit  Medication Sig Dispense Refill  . aspirin 81 MG chewable tablet Chew 1 tablet (81 mg total) by mouth daily. 30 tablet 0  . empagliflozin (JARDIANCE) 25 MG TABS tablet Take 25 mg by mouth daily before breakfast. 30 tablet   . Insulin Glargine (BASAGLAR KWIKPEN) 100 UNIT/ML Inject 21 Units into the skin daily.     . rosuvastatin (CRESTOR) 40 MG tablet Take 40 mg by mouth daily.    . sitaGLIPtin-metformin (JANUMET) 50-1000 MG tablet Take by mouth.    . vitamin B-12 1000 MCG tablet Take 1 tablet (1,000 mcg total) by mouth daily. 30 tablet 0   No current facility-administered medications on file prior to visit.    Allergies:  No Known Allergies    OBJECTIVE:  Physical Exam  Vitals:   12/23/20 0911  BP: 122/81  Pulse: 72  Weight: 130 lb (59 kg)  Height: 5\' 4"  (1.626 m)   Body mass index is 22.31 kg/m. No exam data present  Depression screen [facility] 2/9 12/23/2020  Decreased Interest 0  Down, Depressed, Hopeless 0  PHQ - 2 Score 0     General: well developed, well nourished,  pleasant middle-aged Asian male, seated, in no evident distress Head: head normocephalic and atraumatic.   Neck: supple with no carotid or supraclavicular bruits Cardiovascular: regular rate and rhythm, no murmurs Musculoskeletal: no deformity Skin:  no rash/petichiae Vascular:  Normal pulses all extremities   Neurologic Exam Mental Status: Awake and fully alert.   Fluent speech and language.  Oriented to place and time. Recent and remote memory intact. Attention span, concentration and fund of knowledge appropriate. Mood and affect appropriate.  Cranial Nerves: Fundoscopic exam reveals sharp disc margins. Pupils equal, briskly reactive to light. Extraocular movements full  without nystagmus. Visual fields full to confrontation. Hearing intact. Facial sensation intact. Face, tongue, palate moves normally and symmetrically.  Motor: Normal bulk and tone. Normal strength in all tested extremity muscles Sensory.: intact to touch , pinprick , position and vibratory sensation.  Coordination: Rapid alternating movements normal in all extremities. Finger-to-nose and heel-to-shin performed accurately bilaterally. Gait and Station: Arises from chair without difficulty. Stance is normal. Gait demonstrates normal stride length and balance without use of assistive device. Tandem walk and heel toe without difficulty.  Reflexes: 1+ and symmetric. Toes downgoing.     NIHSS  0 Modified Rankin  1      ASSESSMENT: Chad Mcbride is a 60 y.o. year old male presented on 11/21/2020 after episode of unresponsiveness with stroke work-up revealing lateral hypothalamus and thalamic infarcts consistent with artery of percheron infarct, likely secondary to small vessel disease. Vascular risk factors include HTN, HLD, DM and B12 deficiency.      PLAN:  1. Artery  of Percheron stroke : Recovered well without residual deficit.  Does have mild fatigue and discussed typical recovery time and should continue to improve.  Continue aspirin 81 mg daily  and Crestor for secondary stroke prevention.  Discussed secondary stroke prevention measures and importance of close PCP follow up for aggressive stroke risk factor management  2. HTN: BP goal <130/90.  Stable on nonpharmacological management per PCP 3. HLD: LDL goal <70. Recent LDL 69.7 on Crestor.  4. DMII: A1c goal<7.0. Recent A1c 7.6.  Glucose levels stable per patient report.  Monitored by PCP 5. B12 deficiency: Advised to continue supplementation. Plans to f/u with PCP in the near future for repeat blood work    Follow up in 6 months or call earlier if needed   CC:  [facility] provider: Dr. Pearlean Brownie PCP: [name], MD    I spent 45  minutes of face-to-face and non-face-to-face time with patient.  This included previsit chart review including recent hospitalization pertinent progress notes, lab work and imaging, lab review, study review, order entry, electronic health record documentation, patient education regarding recent stroke and etiology, post stroke fatigue, importance of managing stroke risk factors and answered all other questions to patient satisfaction   Ihor Austin, AGNP-BC  [facility] [address] [place], [place] [address]  Phone [phone] Fax [phone] Note: This document was prepared with digital dictation and possible smart phrase technology. Any transcriptional errors that result from this process are unintentional.

## 2020-12-23 NOTE — Patient Instructions (Addendum)
Continue aspirin 81 mg daily  and Crestor for secondary stroke prevention  Continue to follow up with PCP regarding cholesterol, blood pressure and diabetes management  Maintain strict control of hypertension with blood pressure goal below 130/90, diabetes with hemoglobin A1c goal below 7% and cholesterol with LDL cholesterol (bad cholesterol) goal below 70 mg/dL.   Ensure that you follow-up with your PCP for repeat B12 levels which is checked through blood work -continue B12 supplement until that time  Your fatigue and energy levels should continue to improve as you slowly return back to your normal activities      Followup in the future with me in 6 months or call earlier if needed       Thank you for coming to see [place] at [facility]. I hope we have been able to provide you high quality care today.  You may receive a patient satisfaction survey over the next few weeks. We would appreciate your feedback and comments so that we may continue to improve ourselves and the health of our patients.     Warning Signs of a Stroke A stroke is a medical emergency and should be treated right away--every second counts. A stroke is caused by a decrease or block in blood flow to the brain. When certain areas of the brain do not get enough oxygen, brain cells begin to die. A stroke can lead to brain damage and can sometimes be life-threatening. However, if a person gets medical treatment right away, he or she has a better chance of surviving and recovering from a stroke. It is very important to be able to recognize the symptoms of a stroke. Types of strokes There are two main types of strokes:  Ischemic stroke. This is the most common type. This stroke happens when a blood vessel that supplies blood to the brain is blocked.  Hemorrhagic stroke. This results from bleeding in the brain when a blood vessel leaks or bursts (ruptures). A transient ischemic attack (TIA) causes  stroke-like symptoms that go away quickly. Unlike a stroke, a TIA does not cause permanent damage to the brain. However, the symptoms of a TIA are the same as a stroke. TIAs also require medical treatment right away. Having a TIA is a sign that you are at higher risk for a stroke. Warning signs of a stroke The symptoms of stroke may vary and will reflect the part of the brain that is involved. Symptoms usually happen suddenly. "BE FAST" is an easy way to remember the main warning signs of a stroke:  B - Balance. Signs are dizziness, sudden trouble walking, or loss of balance.  E - Eyes. Signs are trouble seeing or a sudden change in vision.  F - Face. Signs are sudden weakness or numbness of the face, or the face or eyelid drooping on one side.  A - Arms. Signs are weakness or numbness in an arm. This happens suddenly and usually on one side of the body.  S - Speech. Signs are sudden trouble speaking, slurred speech, or trouble understanding what people say.  T - Time. Time to call emergency services. Write down what time symptoms started. Other signs of a stroke Some less common signs of a stroke include:  A sudden, severe headache with no known cause.  Nausea or vomiting.  Seizure. A stroke may be happening even if only one "BE FAST" symptom is present. These symptoms may represent a serious problem that is an emergency. Do not wait  to see if the symptoms will go away. Get medical help right away. Call your local emergency services (911 in the [place] Do not drive yourself to the hospital.   Summary  A stroke is a medical emergency and should be treated right away--every second counts.  "BE FAST" is an easy way to remember the main warning signs of a stroke.  Call your local emergency services right away if you or someone else has any stroke symptoms, even if the symptoms go away.  Make note of what time the first symptoms appeared. Emergency responders or emergency room staff will  need to know this information.  Do not wait to see if symptoms will go away. Call 911 even if only one of the "BE FAST" symptoms appears. This information is not intended to replace advice given to you by your health care provider. Make sure you discuss any questions you have with your health care provider. Document Revised: 03/24/2020 Document Reviewed: 03/24/2020 Elsevier Patient Education  2021 [employer].

## 2020-12-29 NOTE — Progress Notes (Signed)
I agree with the above plan 

## 2021-02-05 ENCOUNTER — Other Ambulatory Visit: Payer: Self-pay | Admitting: Cardiology

## 2021-03-28 IMAGING — MR MR HEAD WO/W CM
19 of 21 series · 35 of 48 positions shown · IV contrast (gadavist)
Comparison: CT head and CTA yesterday.

CLINICAL DATA: 59-year-old male is unresponsive, seizures.

EXAM:
MRI HEAD WITHOUT AND WITH CONTRAST
TECHNIQUE: Multiplanar, multiecho pulse sequences of the brain and surrounding
structures were obtained without and with intravenous contrast.
CONTRAST:  6.5mL GADAVIST GADOBUTROL 1 MMOL/ML IV SOLN

[Series 7: DWI · axial · 3.0mm · 0.88mm/px · z∈[-65,+75]mm · 2 of 100 slices shown (1 of 4)]
[im 1/100]
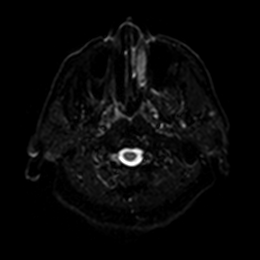
[im 100/100]
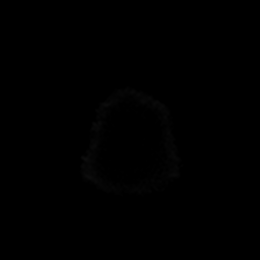

[Series 8: DWI · axial · 3.0mm · 0.88mm/px · z∈[-65,+75]mm · 2 of 50 slices shown (2 of 4)]
[im 1/50]
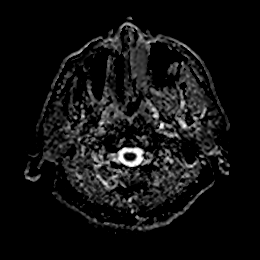
[im 50/50]
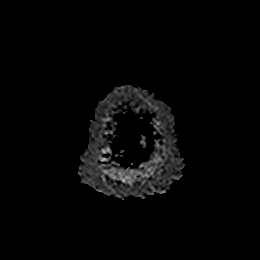

[Series 9: T1 · sagittal · 5.0mm · 0.75mm/px · 1 of 25 slices shown (1 of 2)]
[im 1/25]
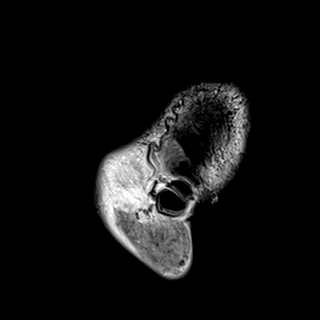

[Series 10: DWI · coronal · 4.0mm · 0.88mm/px · 2 of 70 slices shown (3 of 4)]
[im 1/70]
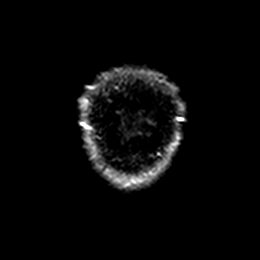
[im 70/70]
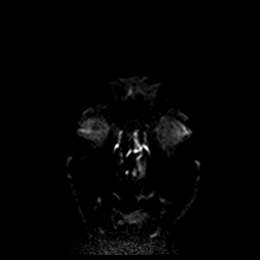

[Series 11: DWI · coronal · 4.0mm · 0.88mm/px · 1 of 35 slices shown (4 of 4)]
[im 1/35]
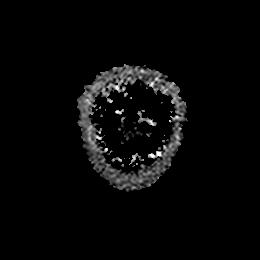

[Series 12: T2 · axial · 5.0mm · 0.72mm/px · 1 of 27 slices shown (1 of 2)]
[im 1/27]
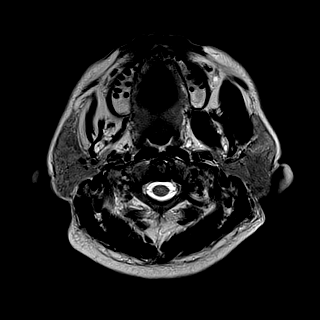

[Series 13: FLAIR · axial · 5.0mm · 0.45mm/px · 1 of 27 slices shown (1 of 2)]
[im 1/27]
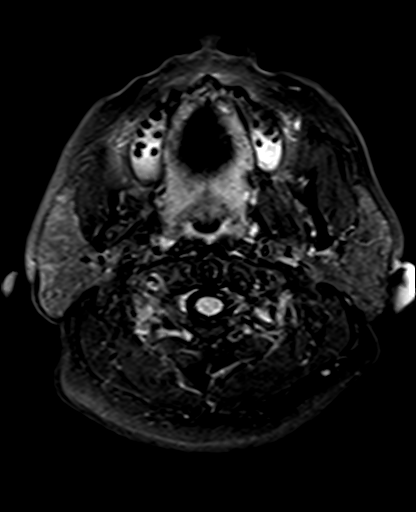

[Series 14: mag_images · axial · 3.0mm · 0.90mm/px · z∈[-85,+85]mm · 2 of 60 slices shown]
[im 1/60]
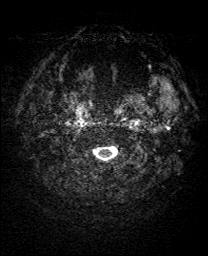
[im 60/60]
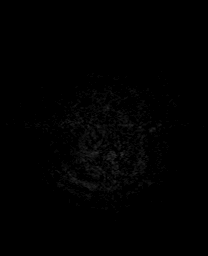

[Series 15: pha_images · axial · 3.0mm · 0.90mm/px · z∈[-85,+79]mm · 2 of 58 slices shown]
[im 1/58]
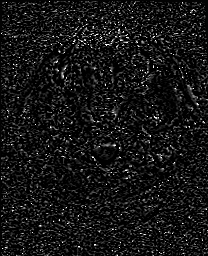
[im 58/58]
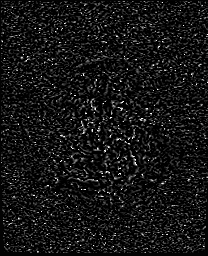

[Series 16: swi_images · axial · 3.0mm · 0.90mm/px · z∈[-85,+85]mm · 2 of 60 slices shown]
[im 1/60]
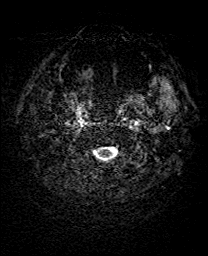
[im 60/60]
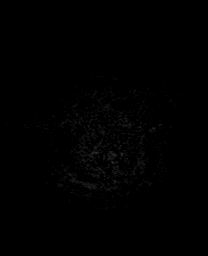

[Series 17: mip_images(sw) · axial · 24.0mm · 0.90mm/px · z∈[-75,+75]mm · 2 of 53 slices shown]
[im 1/53]
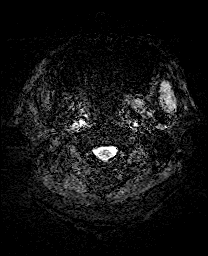
[im 53/53]
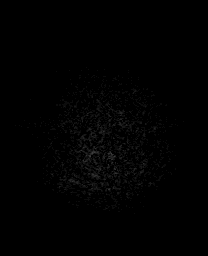

[Series 18: t1_mprage_tra_p2_iso · axial · 1.0mm · 0.98mm/px · z∈[-83,+85]mm · 6 of 176 slices shown]
[im 1/176]
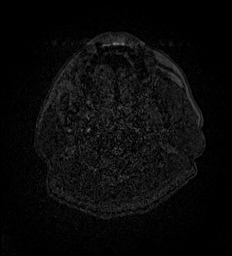
[im 36/176]
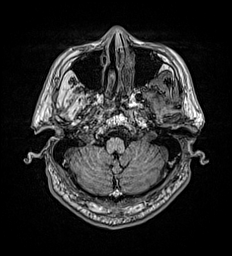
[im 71/176]
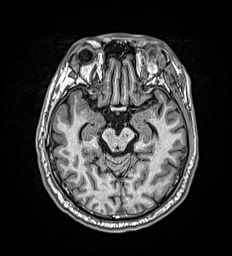
[im 106/176]
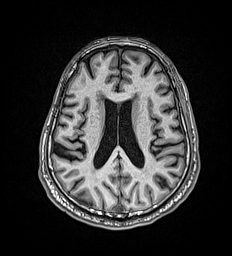
[im 141/176]
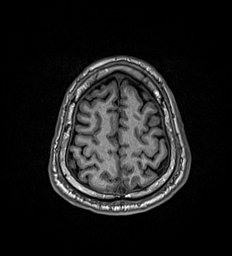
[im 176/176]
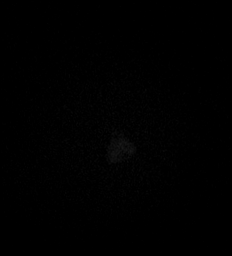

[Series 19: t1_mprage_tra_p2_iso_mpr_coronal · coronal · 1.0mm · 0.45mm/px · 5 of 180 slices shown]
[im 1/180]
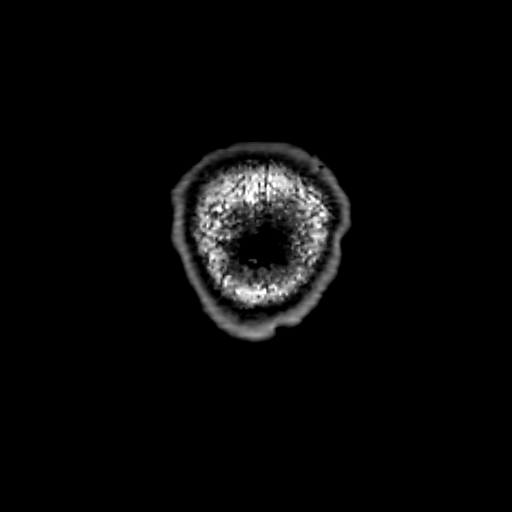
[im 36/180]
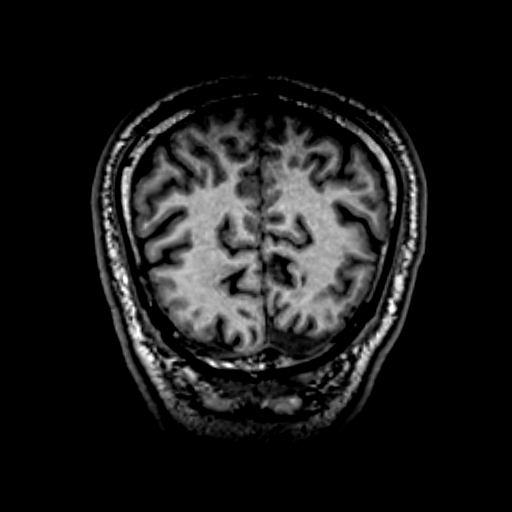
[im 72/180]
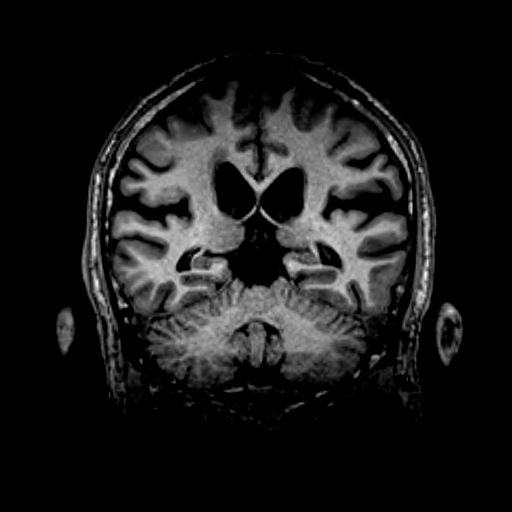
[im 108/180]
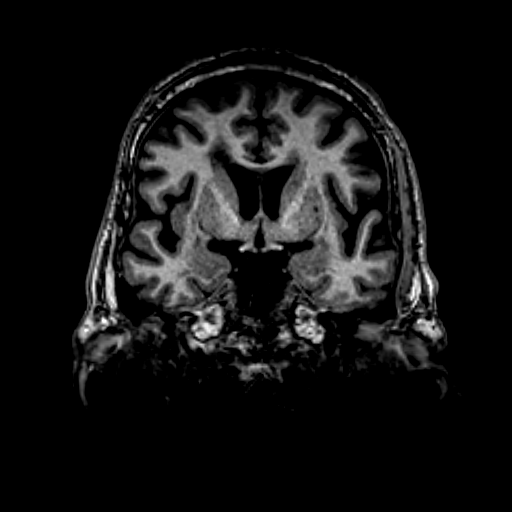
[im 144/180]
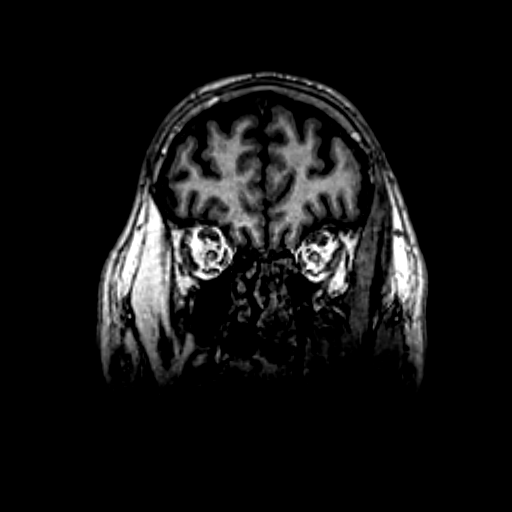

[Series 20: T2 · coronal · 3.0mm · 0.27mm/px · 1 of 32 slices shown (2 of 2)]
[im 1/32]
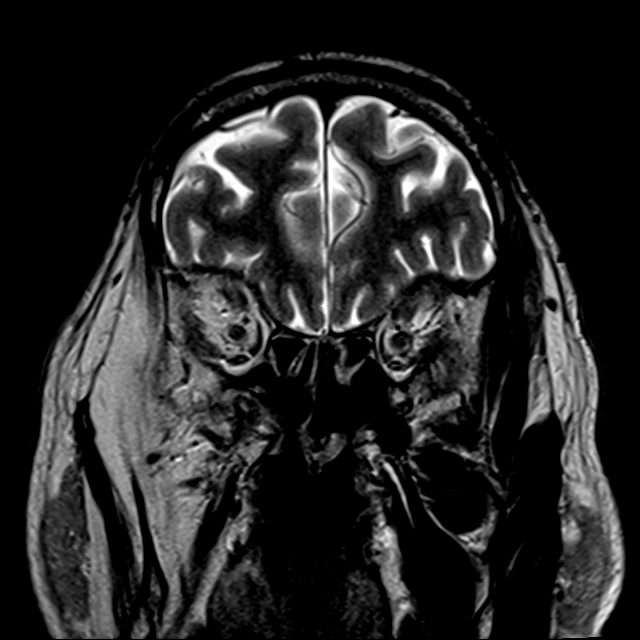

[Series 21: FLAIR · coronal · 3.0mm · 0.56mm/px · 1 of 32 slices shown (2 of 2)]
[im 1/32]
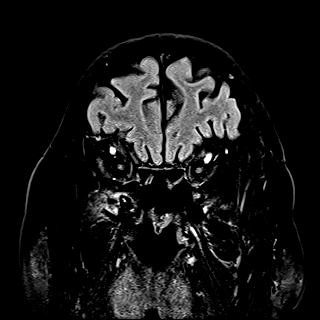

[Series 22: T2 post-contrast · coronal · 5.0mm · 0.72mm/px · 1 of 30 slices shown]
[im 1/30]
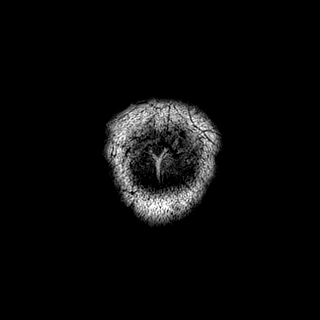

[Series 25: T1 post-contrast · sagittal · 5.0mm · 0.75mm/px · 1 of 25 slices shown (1 of 2)]
[im 1/25]
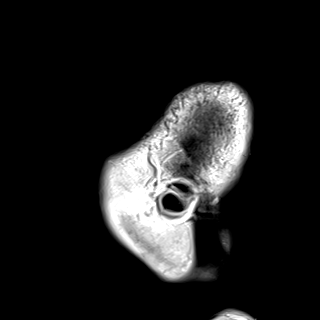

[Series 28: T1 post-contrast · coronal · 5.0mm · 0.34mm/px · 1 of 30 slices shown (2 of 2)]
[im 1/30]
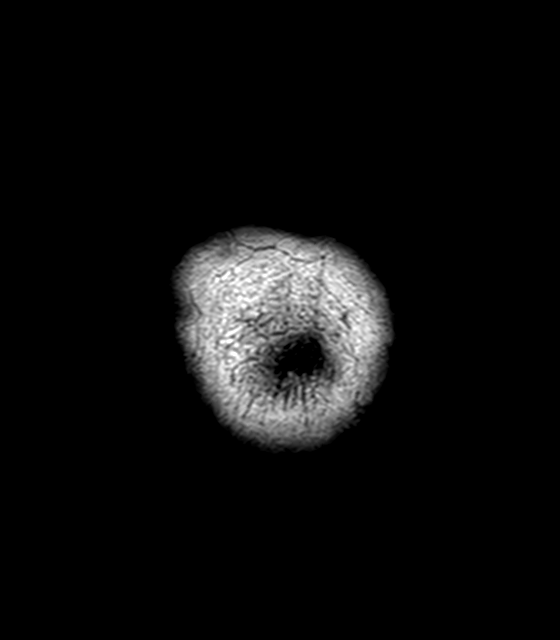

[Series 29: T1 · axial · 5.0mm · 0.40mm/px · 1 of 25 slices shown (2 of 2)]
[im 1/25]
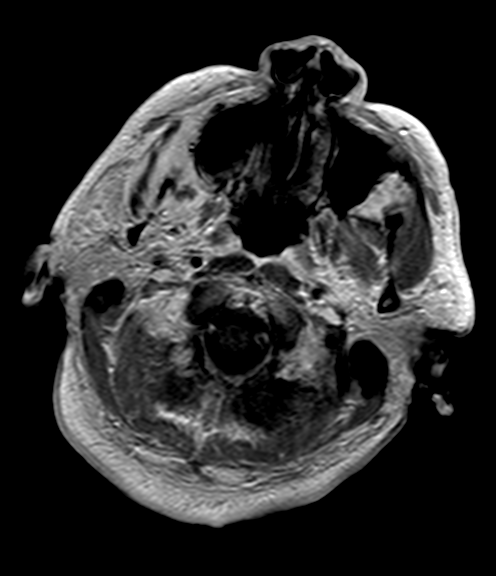

[35 of 48 positions shown; findings below may reference images not displayed]

FINDINGS: Brain: Quads I symmetric intense diffusion restriction along the
medial aspect of both thalami (series 7, image 74 and series 8,
image 24). Associated T2 and FLAIR hyperintensity, with
microhemorrhage or petechial hemorrhage associated on the left side
(series 16, image 30). No mass effect. T1 hypointensity. No
enhancement.

No other restricted diffusion. No midline shift, mass effect,
evidence of mass lesion, ventriculomegaly, extra-axial collection.
Cervicomedullary junction and pituitary are within normal limits.

Outside of the acute findings there are multifocal areas of white
matter T2 and FLAIR hyperintensity with cystic encephalomalacia most
resembling white matter lacunar infarcts (series 13, image 18 and
image 19 bilateral corona radiata). But T2 heterogeneity in the
right basal ganglia could be perivascular spaces. Questionable small
chronic infarct in the right cerebellum versus asymmetric prominence
of the folia. No cortical encephalomalacia or other cerebral blood
products identified.

Thin slice coronal images demonstrate symmetric hippocampal
formations and mesial temporal lobes, along with the abnormal signal
in the medial thalami. No abnormal enhancement identified. No dural
thickening.

Vascular: Major intracranial vascular flow voids are stable to the
CTA findings yesterday. Dominant left vertebral artery which
supplies the basilar. Absent flow void distal right vertebral artery
compatible with occlusion. The major dural venous sinuses are
enhancing and appear to be patent.

Skull and upper cervical spine: Partially visible degenerative
cervical spinal stenosis at C4-C5. Visualized bone marrow signal is
within normal limits.

Sinuses/Orbits: Negative.

Other: Mastoids are clear. Visible internal auditory structures
appear normal. Visible scalp and face appear negative.
IMPRESSION: 1. Positive for acute Artery of Chia infarction, with abnormal
paramedian thalami. Punctate hemorrhage associated on the left. No
mass effect.
2. Evidence of underlying chronic lacunar infarcts in the bilateral
cerebral white matter.
3. Large vessel flow voids are stable to the CTA findings yesterday.
4. Partially visible degenerative cervical spinal stenosis at C4-C5.

## 2021-05-07 ENCOUNTER — Other Ambulatory Visit: Payer: Self-pay | Admitting: Cardiology

## 2021-05-25 ENCOUNTER — Encounter: Payer: Self-pay | Admitting: Occupational Therapy

## 2021-05-25 ENCOUNTER — Ambulatory Visit: Payer: 59 | Attending: Family Medicine | Admitting: Occupational Therapy

## 2021-05-25 ENCOUNTER — Other Ambulatory Visit: Payer: Self-pay

## 2021-05-25 DIAGNOSIS — R41842 Visuospatial deficit: Secondary | ICD-10-CM | POA: Insufficient documentation

## 2021-05-25 NOTE — Therapy (Signed)
[facility] [address] [place], [place], [address] Phone: [phone]   Fax:  [phone]  Occupational Therapy Evaluation  Patient Details  Name: Chad Mcbride MRN: [id number] Date of Birth: 08-26-1961 Referring Provider (OT): Virl Son, MD (PCP)   Encounter Date: 05/25/2021   OT End of Session - 05/25/21 1029     Visit Number 1    Number of Visits 13    Date for OT Re-Evaluation 07/20/21    Authorization Type UHC    Authorization Time Period VL: 60 combined    OT Start Time 1025   arrival time   OT Stop Time 1100    OT Time Calculation (min) 35 min    Activity Tolerance Patient tolerated treatment well    Behavior During Therapy [facility] for tasks assessed/performed;Impulsive             Past Medical History:  Diagnosis Date   Diabetes mellitus without complication (HCC)    GERD (gastroesophageal reflux disease)    Hyperlipidemia    Hypertension     History reviewed. No pertinent surgical history.  There were no vitals filed for this visit.   Subjective Assessment - 05/25/21 1241     Subjective  Pt is a 60 year old male that presents to Neuro OPOT with reports of R eye not tracking laterally. Pt has PMH significant for DM, HTN, HLD, Ischemic Stroke. Pt had stroke on 11/21/20 and approx 1 month ago reports the R eye stopping tracking laterally. Pt reports only residual deficits from stroke are the right eye. Pt denies any diplopia or blurry vision just some deficits with depth perception and focus with moving objects.    Pertinent History PMH: HLD, HTN, DM, Ischemic Stroke    Patient Stated Goals "only thing is my eyes"    Currently in Pain? No/denies               [facility] OT Assessment - 05/25/21 1031       Assessment   Medical Diagnosis Low Vision    Referring Provider (OT) Virl Son, MD (PCP)    Onset Date/Surgical Date 11/21/20   CVA date - symptoms started 1 month ago   Hand Dominance Right     Prior Therapy PT for neck pain years ago      Precautions   Precautions None      Balance Screen   Has the patient fallen in the past 6 months No      Home  Environment   Family/patient expects to be discharged to: Private residence    Living Arrangements Spouse/significant other   dog, cat, 1 kid   Type of Home House    Home Layout Bed/Bath upstairs    Bathroom Shower/Tub Tub/Shower unit      Prior Function   Level of Independence Independent    Vocation Retired    Leisure watch TV, outdoor activities      ADL   ADL comments Pt reports being independent with all ADLs and IADLs.      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      Written Expression   Dominant Hand Right      Vision - History   Baseline Vision Wears glasses only for reading      Vision Assessment   Eye Alignment Impaired (comment)    Ocular Range of Motion Restricted on right    Alignment/Gaze Preference  Head turned    Tracking/Visual Pursuits Right eye does not track laterally    Saccades Impaired - to be further tested in functional context    Convergence Within functional limits    Visual Fields Right visual field deficit    Diplopia Assessment --   pt denies diplopia   Depth Perception impaired with movement of objects    Comment Visual Perceptual WFL on brief assessment - continue to assess in functional context      Cognition   Behaviors Impulsive      Observation/Other Assessments   Focus on Therapeutic Outcomes (FOTO)  N/A      Sensation   Light Touch Appears Intact      Coordination   9 Hole Peg Test Right;Left    Right 9 Hole Peg Test 27.22s    Left 9 Hole Peg Test 24.06s      ROM / Strength   AROM / PROM / Strength Strength;AROM      AROM   Overall AROM  Within functional limits for tasks performed      Strength   Overall Strength Within functional limits for tasks performed      Hand Function   Right Hand  Gross Grasp Functional    Right Hand Grip (lbs) 69    Left Hand Gross Grasp Functional    Left Hand Grip (lbs) 54.4                                OT Short Term Goals - 05/25/21 1645       OT SHORT TERM GOAL #1   Title Pt will be independent with initial HEP    Time 4    Period Weeks    Status New    Target Date 06/22/21      OT SHORT TERM GOAL #2   Title Pt will verbalize understanding of safety recommendations while driving d/t visual deficits.    Baseline currently driving - not at night or bad weather per patient.    Time 4    Period Weeks    Status New      OT SHORT TERM GOAL #3   Title Pt will perform environmental scanning with 90% accuracy in mod distracting environment    Time 4    Period Weeks    Status New               OT Long Term Goals - 05/25/21 1646       OT LONG TERM GOAL #1   Title Pt will be independent with any updated HEPs    Time 8    Period Weeks    Status New    Target Date 07/20/21      OT LONG TERM GOAL #2   Title Pt will verbalize understanding of vision therapy and/or specialist recommendations PRN    Time 8    Period Weeks    Status New      OT LONG TERM GOAL #3   Title Pt will verbalize understanding of adapted strategies for increased safety with vision deficits with R eye.    Time 4    Period Weeks    Status New      OT LONG TERM GOAL #4   Title Pt will perform physical and cognitive tasks simultaneously with 90% accuracy or greater.    Time 8    Period Weeks    Status New  Plan - 05/25/21 1256     Clinical Impression Statement Pt is a 60 year old male that presents to Neuro OPOT with referral for low vision. Pt reports of R eye not tracking laterally. Pt has PMH significant for DM, HTN, HLD, Ischemic Stroke. Pt had stroke on 11/21/20 and approx 1 month ago reports the R eye stopping tracking laterally. Pt presents with visual deficits with oculomotor skills and right  eye tracking laterally and with some depth preception and focus with moving objects and gaze stabilization. Pt was slightly impulsive during evaluation and will continue to assess in functional context. Skilled occupational therapy is recommended to target listed areas of deficit and increase independence with ADLs and IADLS.    OT Occupational Profile and History Problem Focused Assessment - Including review of records relating to presenting problem    Occupational performance deficits (Please refer to evaluation for details): IADL's;ADL's;Leisure    Body Structure / Function / Physical Skills ADL;Vision    Cognitive Skills Energy/Drive;Attention    Rehab Potential Fair   pt did not schedule and said he would check with PCP re: therapy. Also, unsure prognosis with visual deficits at this time - will continue to monitor and assess   Clinical Decision Making Limited treatment options, no task modification necessary    Comorbidities Affecting Occupational Performance: None    Modification or Assistance to Complete Evaluation  No modification of tasks or assist necessary to complete eval    OT Frequency 2x / week    OT Duration 8 weeks   12 visits over 8 weeks   OT Treatment/Interventions Self-care/ADL training;Patient/family education;Visual/perceptual remediation/compensation;DME and/or AE instruction;Functional Mobility Training;Cognitive remediation/compensation    Plan oculomotor HEP    Consulted and Agree with Plan of Care Patient             Patient will benefit from skilled therapeutic intervention in order to improve the following deficits and impairments:   Body Structure / Function / Physical Skills: ADL, Vision Cognitive Skills: Energy/Drive, Attention     Visit Diagnosis: Visuospatial deficit    Problem List Patient Active Problem List   Diagnosis Date Noted   Cerebral thrombosis with cerebral infarction 11/22/2020   Recurrent episodes of unresponsiveness 11/21/2020     Junious Dresser, OT/L 05/25/2021, 4:51 PM  [facility] [address] [place], [place], [address] Phone: [phone]   Fax:  [phone]  Name: Chad Mcbride MRN: [id number] Date of Birth: 1961/02/10

## 2023-12-20 ENCOUNTER — Other Ambulatory Visit: Payer: Self-pay | Admitting: Family Medicine

## 2023-12-20 DIAGNOSIS — Q2572 Congenital pulmonary arteriovenous malformation: Secondary | ICD-10-CM

## 2023-12-23 ENCOUNTER — Encounter: Payer: Self-pay | Admitting: Interventional Radiology

## 2023-12-23 ENCOUNTER — Ambulatory Visit
Admission: RE | Admit: 2023-12-23 | Discharge: 2023-12-23 | Disposition: A | Discharge status: Home / Self Care | Source: Ambulatory Visit | Attending: Family Medicine | Admitting: Family Medicine

## 2023-12-23 ENCOUNTER — Other Ambulatory Visit: Payer: Self-pay | Admitting: Interventional Radiology

## 2023-12-23 DIAGNOSIS — Q2572 Congenital pulmonary arteriovenous malformation: Secondary | ICD-10-CM

## 2023-12-23 HISTORY — PX: IR RADIOLOGIST EVAL & MGMT: IMG5224

## 2023-12-23 NOTE — Consult Note (Signed)
 Chief Complaint: Patient was seen in consultation today for  at the request of Jolee Madelin Patch  Referring Physician(s): Jolee Madelin Patch  History of Present Illness: Chad Mcbride is a 63 y.o. male With a history of multiple unexplained strokes.  He was previously worked up at [facility] and had a negative ECHO bubble study in 2022.  A TEE bubble study was then repeated at [facility] which was positive for relatively delayed bubbles in the left atrium on the fifth heartbeat following bubble arrival in the right atrium.  This can be seen in the setting of a small PFO or with a pulmonary AVM.  A CTA of the chest was performed at [facility] on September 21, 2023, but was not protocoled specifically to assess the pulmonary arteries..  I have reviewed these images and detect no evidence of pulmonary AVM.   The patient does not have a formal diagnosis of pulmonary AVM, just the suspicion raised by the transesophageal echo bubble study.  He is currently wheelchair-bound but awake and alert.  He is currently residing in a rehabilitation center.  He presents with his family who is understandably very concerned about the possibility of him continuing to have additional strokes.    Past Medical History:  Diagnosis Date   Diabetes mellitus without complication (HCC)    GERD (gastroesophageal reflux disease)    Hyperlipidemia    Hypertension     No past surgical history on file.  Allergies: Patient has no known allergies.  Medications: Prior to Admission medications   Medication Sig Start Date End Date Taking? Authorizing Provider  aspirin  81 MG chewable tablet Chew 1 tablet (81 mg total) by mouth daily. 11/24/20   Vicci Cough, MD  empagliflozin  (JARDIANCE ) 25 MG TABS tablet Take 25 mg by mouth daily before breakfast. 12/14/19   Jordan, Peter M, MD  Insulin  Glargine [facility]) 100 UNIT/ML Inject 21 Units into the skin daily.  11/24/19    [provider]  rosuvastatin  (CRESTOR ) 40 MG tablet TAKE 1 TABLET(40 MG) BY MOUTH DAILY 05/07/21   Jordan, Peter M, MD  sitaGLIPtin-metformin (JANUMET) 50-1000 MG tablet Take by mouth. 04/21/17   [provider]  vitamin B-12 1000 MCG tablet Take 1 tablet (1,000 mcg total) by mouth daily. 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  Tobacco Use   Smoking status: Every Day    Current packs/day: 0.25    Types: Cigarettes   Smokeless tobacco: Never  Substance and Sexual Activity   Alcohol use: Not Currently   Drug use: Not on file   Sexual activity: Yes  Other Topics Concern   Not on file  Social History Narrative   ** Merged History Encounter **       Social Drivers of Health   Financial Resource Strain: Not on file  Food Insecurity: Low Risk  (11/28/2023)   Received from [facility]   Hunger Vital Sign    Worried About Running Out of Food in the Last Year: Never true    Ran  Out of Food in the Last Year: Never true  Transportation Needs: No Transportation Needs (11/28/2023)   Received from [facility]   Transportation    In the past 12 months, has lack of reliable transportation kept you from medical appointments, meetings, work or from getting things needed for daily living? : No  Physical Activity: Not on file  Stress: Not on file  Social Connections: Not on file    Review of Systems: A 12 point ROS discussed and pertinent positives are indicated in the HPI above.  All other systems are negative.  Review of Systems  Vital Signs: BP 127/86 (BP Location: Right Arm)   Pulse 87   Temp (!) 97.4 F (36.3 C)   Resp  16   SpO2 97%    Physical Exam Constitutional:      General: He is not in acute distress.    Appearance: Normal appearance.     Comments: In wheelchair  HENT:     Head: Normocephalic and atraumatic.  Eyes:     General: No scleral icterus. Cardiovascular:     Rate and Rhythm: Normal rate.  Pulmonary:     Effort: Pulmonary effort is normal.  Abdominal:     General: Abdomen is flat. There is no distension.     Tenderness: There is no abdominal tenderness.  Skin:    General: Skin is warm and dry.  Neurological:     Mental Status: He is alert and oriented to person, place, and time.  Psychiatric:        Behavior: Behavior normal.       Imaging: No results found.  Labs:  CBC: No results for input(s): WBC, HGB, HCT, PLT in the last 8760 hours.  COAGS: No results for input(s): INR, APTT in the last 8760 hours.  BMP: No results for input(s): NA, K, CL, CO2, GLUCOSE, BUN, CALCIUM , CREATININE, GFRNONAA, GFRAA in the last 8760 hours.  Invalid input(s): CMP  LIVER FUNCTION TESTS: No results for input(s): BILITOT, AST, ALT, ALKPHOS, PROT, ALBUMIN in the last 8760 hours.  TUMOR MARKERS: No results for input(s): AFPTM, CEA, CA199, CHROMGRNA in the last 8760 hours.  Assessment and Plan:  Pleasant 63 year old male with a history of recurrent strokes of uncertain etiology.  He presents today to be evaluated for a possible pulmonary AVM.  He has a CTA chest dated 09/21/2023 which was obtained at [facility].  This exam was protocol for evaluation of his thoracic aortic aneurysm rather than to evaluate the pulmonary arteries.  I have reviewed the imaging studies and see no clear evidence of a pulmonary AVM.  However, this study is not as sensitive as a dedicated CTA to evaluate the pulmonary arteries.    1.)  CTA Chestt PE Protocol to evaluate for possible pulmonary AVM.   2.)  Follow-up My Chart video conference  after to review results.   Thank you for this interesting consult.  I greatly enjoyed meeting Carmeron Ozanich and look forward to participating in their care.  A copy of this report was sent to the requesting provider on this date.  Electronically Signed: Wilkie MARLA Lent 12/23/2023, 1:45 PM   I spent a total of  30 Minutes  in face to face in clinical consultation, greater than 50% of which was counseling/coordinating care for possible pulmonary AVM.

## 2024-05-10 ENCOUNTER — Encounter (HOSPITAL_COMMUNITY): Payer: Self-pay | Admitting: Cardiology

## 2024-05-10 ENCOUNTER — Ambulatory Visit (HOSPITAL_COMMUNITY)
Admission: RE | Admit: 2024-05-10 | Discharge: 2024-05-10 | Disposition: A | Discharge status: Home / Self Care | Source: Ambulatory Visit | Attending: Cardiology | Admitting: Cardiology

## 2024-05-10 VITALS — BP 110/78 | HR 76

## 2024-05-10 DIAGNOSIS — I69954 Hemiplegia and hemiparesis following unspecified cerebrovascular disease affecting left non-dominant side: Secondary | ICD-10-CM | POA: Diagnosis not present

## 2024-05-10 DIAGNOSIS — E119 Type 2 diabetes mellitus without complications: Secondary | ICD-10-CM | POA: Diagnosis not present

## 2024-05-10 DIAGNOSIS — Z87891 Personal history of nicotine dependence: Secondary | ICD-10-CM | POA: Diagnosis not present

## 2024-05-10 DIAGNOSIS — Z7982 Long term (current) use of aspirin: Secondary | ICD-10-CM | POA: Diagnosis not present

## 2024-05-10 DIAGNOSIS — Z993 Dependence on wheelchair: Secondary | ICD-10-CM | POA: Diagnosis not present

## 2024-05-10 DIAGNOSIS — R0602 Shortness of breath: Secondary | ICD-10-CM

## 2024-05-10 DIAGNOSIS — R5383 Other fatigue: Secondary | ICD-10-CM | POA: Diagnosis not present

## 2024-05-10 DIAGNOSIS — Z7902 Long term (current) use of antithrombotics/antiplatelets: Secondary | ICD-10-CM | POA: Diagnosis not present

## 2024-05-10 DIAGNOSIS — R0600 Dyspnea, unspecified: Secondary | ICD-10-CM | POA: Insufficient documentation

## 2024-05-10 DIAGNOSIS — Z79899 Other long term (current) drug therapy: Secondary | ICD-10-CM | POA: Insufficient documentation

## 2024-05-10 DIAGNOSIS — E785 Hyperlipidemia, unspecified: Secondary | ICD-10-CM | POA: Diagnosis not present

## 2024-05-10 NOTE — Progress Notes (Signed)
 PCP: Jolee Madelin Patch, MD  Chief complaint: Fatigue  63 y.o. with history of type 2 diabetes, hyperlipidemia, and recurrent CVAs was self-referred for evaluation of fatigue/dyspnea. Patient had initial CVA in 2023, right pontine.  Recurrent CVA in 12/24 (left frontal/right putaminal) and again in 3/25.  He has residual left-sided weakness and uses a wheelchair.  Workup included TEE in 2023 that showed PFO with long tunnel but bubble study negative. Echo in 12/24 showed EF 65-70%, mild LVH, RV normal size/systolic function, bubbles noted in LA on 5th beat with bubble study suggesting small PFO vs pulmonary AVMs.  CTA chest showed no pulmonary AVMs.  CTA head/neck in 3/25 showed no significant carotid stenosis. He had an ILR implanted to screen for atrial fibrillation.  Per his report, AF has not been noted.  He is currently being treated with ASA 81 + Plavix  75 daily. He saw neurology and cardiology through [facility].  It sounds like the cardiologist initially recommended PFO closure, but on discussion with the neurologist decided that the PFO was too small to explain his recurrent CVAs.    Patient is wheelchair-bound.  He is no longer smoking.  He works with PT/OT.  He reports fatigue and dyspnea when he works with PT. He gets tired very easily.  No lightheadedness or palpitations. Not able to walk.  No chest pain.   ECG (personally reviewed): NSR, normal  REDS clip 26%  Labs (7/25): K 4.4, creatinine 1.03, LDL 37, Lp(a) 57  PMH: 1. Hyperlipidemia 2. Type 2 diabetes 3. CVA: Patient has had multiple CVAs.  2023 right pontine, 12/24 left frontal and right putaminal, CVA 3/25.  Residual left hemiparesis.  - TEE (2023): PFO noted with long tunnel but bubble study negative.  - Echo (12/24): EF 65-70%, mild LVH, RV normal size/systolic function, bubbles noted in LA on 5th beat with bubble study suggesting small PFO vs pulmonary AVMs.  - CT chest (5/25): No pulmonary AVMs.  - CTA head/neck (3/25): No  significant carotid stenosis.   - He has ILR.   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  Prior smoker, has quit.  No ETOH.   Family History  Problem Relation Age of Onset   Diabetes Mother    Alcohol abuse Father    ROS: All systems reviewed and negative except as per HPI.   Current Outpatient Medications  Medication Sig Dispense Refill   aspirin  81 MG chewable tablet Chew 1 tablet (81 mg total) by mouth daily. 30 tablet 0   Cholecalciferol (VITAMIN D-3 PO) Take 1 tablet by mouth daily.     clopidogrel  (PLAVIX ) 75 MG tablet Take 75 mg by mouth daily.     Coenzyme Q10 (CO Q 10 PO) Take 1 tablet by mouth daily.     empagliflozin  (JARDIANCE ) 25 MG TABS tablet Take 25 mg by mouth daily before breakfast. 30 tablet    Insulin  Glargine (BASAGLAR KWIKPEN) 100 UNIT/ML Inject 21 Units into the skin daily.      metFORMIN (GLUCOPHAGE) 1000 MG tablet Take 1,000 mg by mouth 2 (two) times daily with a meal.     multivitamin (ONE-A-DAY MEN'S) TABS tablet Take 1 tablet by mouth daily.     rosuvastatin  (CRESTOR ) 20 MG tablet Take 10 mg by mouth daily.     vitamin B-12 1000 MCG tablet Take 1 tablet (1,000 mcg total) by mouth daily. 30 tablet 0   No current facility-administered medications for this encounter.   BP 110/78   Pulse 76  SpO2 96%  General: NAD Neck: No JVD, no thyromegaly or thyroid nodule.  Lungs: Clear to auscultation bilaterally with normal respiratory effort. CV: Nondisplaced PMI.  Heart regular S1/S2, no S3/S4, no murmur.  No peripheral edema.  No carotid bruit.  Normal pedal pulses.  Abdomen: Soft, nontender, no hepatosplenomegaly, no distention.  Skin: Intact without lesions or rashes.  Neurologic: Alert and oriented x 3. Left-sided weakness.  Psych: Normal affect. Extremities: No clubbing or cyanosis.  HEENT: Normal.   Assessment/Plan: 1. Fatigue/dyspnea: Echo in 12/24 showed EF 65-70%, mild LVH, RV normal size/systolic function, bubbles noted in LA on  5th beat with bubble study suggesting small PFO vs pulmonary AVMs.  Patient does not look volume overloaded on exam.  REDS clip today did not suggest volume overload.  I think that his dyspnea and fatigue are likely due to deconditioning rather than cardiac disease.  I recommended that he continue to work aggressively with PT/OT.  2. Hyperlipidemia: Goal LDL < 55 with vascular disease/CVA.  Lp(a) minimally elevated.   - Continue Crestor  10 mg daily.  3. CVA: Recurrent cryptogenic strokes.  No evidence for carotid stenosis.  ILR in place, no atrial fibrillation detected so far.  He has a small PFO present, has been evaluated through [facility] and cardiology/neurology have decided that this is not likely to be the source of recurrent CVAs and have not recommended closure.  Based on the TEE and TTE reports, I think that sounds reasonable. It is possible that recurrent CVAs are due to intracranial vascular disease in the setting of poorly controlled DM.  For now, I agree that the management should be control of vascular risk factors + antiplatelets. BP is not elevated.  - Continue ASA 81 and Plavix .  - Continue to monitor ILR, anticoagulation if AF is found.  - Continue statin.  - Continue management of diabetes.  - Genetic testing for CADASIL sent.   I will see him back prn in the future. His current insurance does not cover [place].   I spent 56 minutes reviewing records, interviewing/examining patient, and managing orders.   Ezra Shuck 05/10/2024 5:25 PM

## 2024-05-10 NOTE — Patient Instructions (Signed)
 No changes to medications.  Your physician recommends that you schedule a follow-up appointment in: as needed.  If you have any questions or concerns before your next appointment please send us  a message through [place] or call our office at [phone].    TO LEAVE A MESSAGE FOR THE NURSE SELECT OPTION 2, PLEASE LEAVE A MESSAGE INCLUDING: YOUR NAME DATE OF BIRTH CALL BACK NUMBER REASON FOR CALL**this is important as we prioritize the call backs  YOU WILL RECEIVE A CALL BACK THE SAME DAY AS LONG AS YOU CALL BEFORE 4:00 PM

## 2024-05-10 NOTE — Progress Notes (Signed)
 ReDS Vest / Clip - 05/10/24 1500       ReDS Vest / Clip   Station Marker A    Ruler Value 28    ReDS Value Range Low volume    ReDS Actual Value 26
